# Patient Record
Sex: Female | Born: 1985 | Race: Black or African American | Hispanic: No | Marital: Single | Smoking: Never smoker
Health system: Southern US, Community
[De-identification: ages and names within clinical notes are randomized; demographics above are authoritative.]

## PROBLEM LIST (undated history)

## (undated) DIAGNOSIS — Z8619 Personal history of other infectious and parasitic diseases: Secondary | ICD-10-CM

## (undated) DIAGNOSIS — R87629 Unspecified abnormal cytological findings in specimens from vagina: Secondary | ICD-10-CM

## (undated) DIAGNOSIS — A609 Anogenital herpesviral infection, unspecified: Secondary | ICD-10-CM

## (undated) HISTORY — DX: Anogenital herpesviral infection, unspecified: A60.9

## (undated) HISTORY — PX: NO PAST SURGERIES: SHX2092

## (undated) HISTORY — PX: COLPOSCOPY: SHX161

## (undated) HISTORY — DX: Unspecified abnormal cytological findings in specimens from vagina: R87.629

## (undated) HISTORY — DX: Personal history of other infectious and parasitic diseases: Z86.19

---
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
## 2011-05-03 ENCOUNTER — Other Ambulatory Visit: Payer: Self-pay | Admitting: Family Medicine

## 2011-05-03 DIAGNOSIS — N6313 Unspecified lump in the right breast, lower outer quadrant: Secondary | ICD-10-CM

## 2011-05-03 DIAGNOSIS — N6321 Unspecified lump in the left breast, upper outer quadrant: Secondary | ICD-10-CM

## 2011-05-06 ENCOUNTER — Other Ambulatory Visit: Payer: Self-pay

## 2011-05-09 ENCOUNTER — Ambulatory Visit
Admission: RE | Admit: 2011-05-09 | Discharge: 2011-05-09 | Disposition: A | Discharge status: Home / Self Care | Payer: PRIVATE HEALTH INSURANCE | Source: Ambulatory Visit | Attending: Family Medicine | Admitting: Family Medicine

## 2011-05-09 DIAGNOSIS — N6321 Unspecified lump in the left breast, upper outer quadrant: Secondary | ICD-10-CM

## 2011-05-09 DIAGNOSIS — N6313 Unspecified lump in the right breast, lower outer quadrant: Secondary | ICD-10-CM

## 2015-10-06 LAB — OB RESULTS CONSOLE RPR: RPR: NONREACTIVE

## 2015-10-06 LAB — OB RESULTS CONSOLE GC/CHLAMYDIA
CHLAMYDIA, DNA PROBE: NEGATIVE
GC PROBE AMP, GENITAL: NEGATIVE

## 2015-10-06 LAB — OB RESULTS CONSOLE ANTIBODY SCREEN: ANTIBODY SCREEN: NEGATIVE

## 2015-10-06 LAB — OB RESULTS CONSOLE RUBELLA ANTIBODY, IGM: Rubella: IMMUNE

## 2015-10-06 LAB — OB RESULTS CONSOLE HEPATITIS B SURFACE ANTIGEN: HEP B S AG: NEGATIVE

## 2015-10-06 LAB — OB RESULTS CONSOLE HIV ANTIBODY (ROUTINE TESTING): HIV: NONREACTIVE

## 2015-10-06 LAB — OB RESULTS CONSOLE ABO/RH: RH Type: POSITIVE

## 2016-05-11 ENCOUNTER — Encounter (HOSPITAL_COMMUNITY): Payer: Self-pay | Admitting: *Deleted

## 2016-05-11 ENCOUNTER — Telehealth (HOSPITAL_COMMUNITY): Payer: Self-pay | Admitting: *Deleted

## 2016-05-11 NOTE — Telephone Encounter (Signed)
Preadmission screen  

## 2016-05-12 ENCOUNTER — Other Ambulatory Visit: Payer: Self-pay | Admitting: Obstetrics and Gynecology

## 2016-05-12 DIAGNOSIS — O365930 Maternal care for other known or suspected poor fetal growth, third trimester, not applicable or unspecified: Secondary | ICD-10-CM

## 2016-05-13 ENCOUNTER — Encounter (HOSPITAL_COMMUNITY): Payer: Self-pay

## 2016-05-13 ENCOUNTER — Ambulatory Visit (HOSPITAL_COMMUNITY)
Admission: RE | Admit: 2016-05-13 | Discharge: 2016-05-13 | Disposition: A | Discharge status: Home / Self Care | Payer: BC Managed Care – PPO | Source: Ambulatory Visit | Attending: Obstetrics and Gynecology | Admitting: Obstetrics and Gynecology

## 2016-05-13 ENCOUNTER — Other Ambulatory Visit: Payer: Self-pay | Admitting: Obstetrics and Gynecology

## 2016-05-13 DIAGNOSIS — Z3A36 36 weeks gestation of pregnancy: Secondary | ICD-10-CM | POA: Diagnosis not present

## 2016-05-13 DIAGNOSIS — O365930 Maternal care for other known or suspected poor fetal growth, third trimester, not applicable or unspecified: Secondary | ICD-10-CM | POA: Insufficient documentation

## 2016-05-21 ENCOUNTER — Inpatient Hospital Stay (HOSPITAL_COMMUNITY): Admission: RE | Admit: 2016-05-21 | Payer: PRIVATE HEALTH INSURANCE | Source: Ambulatory Visit

## 2016-06-04 ENCOUNTER — Inpatient Hospital Stay (HOSPITAL_COMMUNITY): Payer: BC Managed Care – PPO | Admitting: Anesthesiology

## 2016-06-04 ENCOUNTER — Encounter (HOSPITAL_COMMUNITY): Payer: Self-pay | Admitting: *Deleted

## 2016-06-04 ENCOUNTER — Inpatient Hospital Stay (HOSPITAL_COMMUNITY)
Admission: AD | Admit: 2016-06-04 | Discharge: 2016-06-08 | DRG: 765 | Disposition: A | Discharge status: Home / Self Care | Payer: BC Managed Care – PPO | Source: Ambulatory Visit | Attending: Obstetrics and Gynecology | Admitting: Obstetrics and Gynecology

## 2016-06-04 ENCOUNTER — Encounter (HOSPITAL_COMMUNITY)
Admission: AD | Disposition: A | Discharge status: Home / Self Care | Payer: Self-pay | Source: Ambulatory Visit | Attending: Obstetrics and Gynecology

## 2016-06-04 DIAGNOSIS — O9902 Anemia complicating childbirth: Secondary | ICD-10-CM | POA: Diagnosis present

## 2016-06-04 DIAGNOSIS — O4290 Premature rupture of membranes, unspecified as to length of time between rupture and onset of labor, unspecified weeks of gestation: Secondary | ICD-10-CM

## 2016-06-04 DIAGNOSIS — D649 Anemia, unspecified: Secondary | ICD-10-CM | POA: Diagnosis not present

## 2016-06-04 DIAGNOSIS — O4202 Full-term premature rupture of membranes, onset of labor within 24 hours of rupture: Secondary | ICD-10-CM | POA: Diagnosis present

## 2016-06-04 DIAGNOSIS — Z3A40 40 weeks gestation of pregnancy: Secondary | ICD-10-CM

## 2016-06-04 DIAGNOSIS — O365930 Maternal care for other known or suspected poor fetal growth, third trimester, not applicable or unspecified: Secondary | ICD-10-CM | POA: Diagnosis present

## 2016-06-04 HISTORY — DX: Premature rupture of membranes, unspecified as to length of time between rupture and onset of labor, unspecified weeks of gestation: O42.90

## 2016-06-04 LAB — CBC
HEMATOCRIT: 33.6 % — AB (ref 36.0–46.0)
HEMOGLOBIN: 11.9 g/dL — AB (ref 12.0–15.0)
MCH: 31.4 pg (ref 26.0–34.0)
MCHC: 35.4 g/dL (ref 30.0–36.0)
MCV: 88.7 fL (ref 78.0–100.0)
Platelets: 288 10*3/uL (ref 150–400)
RBC: 3.79 MIL/uL — AB (ref 3.87–5.11)
RDW: 13.5 % (ref 11.5–15.5)
WBC: 9.2 10*3/uL (ref 4.0–10.5)

## 2016-06-04 LAB — OB RESULTS CONSOLE GBS: GBS: NEGATIVE

## 2016-06-04 LAB — TYPE AND SCREEN
ABO/RH(D): A POS
ANTIBODY SCREEN: NEGATIVE

## 2016-06-04 SURGERY — Surgical Case
Anesthesia: Epidural

## 2016-06-04 MED ORDER — LACTATED RINGERS IV SOLN: 500.0000 mL | INTRAVENOUS | Status: DC | PRN

## 2016-06-04 MED ORDER — SOD CITRATE-CITRIC ACID 500-334 MG/5ML PO SOLN
30.0000 mL | ORAL | Status: DC | PRN
  Administered 2016-06-04: 30 mL via ORAL
  Filled 2016-06-04: qty 15

## 2016-06-04 MED ORDER — ACETAMINOPHEN 325 MG PO TABS: 650.0000 mg | ORAL_TABLET | ORAL | Status: DC | PRN

## 2016-06-04 MED ORDER — PHENYLEPHRINE 40 MCG/ML (10ML) SYRINGE FOR IV PUSH (FOR BLOOD PRESSURE SUPPORT): 80.0000 ug | PREFILLED_SYRINGE | INTRAVENOUS | Status: DC | PRN

## 2016-06-04 MED ORDER — OXYTOCIN 40 UNITS IN LACTATED RINGERS INFUSION - SIMPLE MED
2.5000 [IU]/h | INTRAVENOUS | Status: DC
  Filled 2016-06-04: qty 1000

## 2016-06-04 MED ORDER — TERBUTALINE SULFATE 1 MG/ML IJ SOLN
INTRAMUSCULAR | Status: AC
Start: 2016-06-04 — End: 2016-06-04
  Administered 2016-06-04: 0.25 mg via SUBCUTANEOUS
  Filled 2016-06-04: qty 1

## 2016-06-04 MED ORDER — FENTANYL 2.5 MCG/ML BUPIVACAINE 1/10 % EPIDURAL INFUSION (WH - ANES)
14.0000 mL/h | INTRAMUSCULAR | Status: DC | PRN
  Administered 2016-06-04: 14 mL/h via EPIDURAL
  Filled 2016-06-04: qty 100

## 2016-06-04 MED ORDER — LACTATED RINGERS IV SOLN: INTRAVENOUS | Status: DC

## 2016-06-04 MED ORDER — EPHEDRINE 5 MG/ML INJ: 10.0000 mg | INTRAVENOUS | Status: DC | PRN

## 2016-06-04 MED ORDER — LIDOCAINE HCL (PF) 1 % IJ SOLN
30.0000 mL | INTRAMUSCULAR | Status: DC | PRN
  Filled 2016-06-04: qty 30

## 2016-06-04 MED ORDER — DIPHENHYDRAMINE HCL 50 MG/ML IJ SOLN: 12.5000 mg | INTRAMUSCULAR | Status: DC | PRN

## 2016-06-04 MED ORDER — ONDANSETRON HCL 4 MG/2ML IJ SOLN
4.0000 mg | Freq: Four times a day (QID) | INTRAMUSCULAR | Status: DC | PRN
  Administered 2016-06-04: 4 mg via INTRAVENOUS
  Filled 2016-06-04: qty 2

## 2016-06-04 MED ORDER — LACTATED RINGERS IV BOLUS (SEPSIS)
1000.0000 mL | Freq: Once | INTRAVENOUS | Status: DC
  Administered 2016-06-04: 1000 mL via INTRAVENOUS

## 2016-06-04 MED ORDER — LACTATED RINGERS IV SOLN: 500.0000 mL | Freq: Once | INTRAVENOUS | Status: DC

## 2016-06-04 MED ORDER — FENTANYL CITRATE (PF) 100 MCG/2ML IJ SOLN: 50.0000 ug | INTRAMUSCULAR | Status: DC | PRN

## 2016-06-04 MED ORDER — OXYTOCIN BOLUS FROM INFUSION: 500.0000 mL | Freq: Once | INTRAVENOUS | Status: DC

## 2016-06-04 MED ORDER — OXYCODONE-ACETAMINOPHEN 5-325 MG PO TABS: 2.0000 | ORAL_TABLET | ORAL | Status: DC | PRN

## 2016-06-04 MED ORDER — PHENYLEPHRINE 40 MCG/ML (10ML) SYRINGE FOR IV PUSH (FOR BLOOD PRESSURE SUPPORT)
80.0000 ug | PREFILLED_SYRINGE | INTRAVENOUS | Status: DC | PRN
  Filled 2016-06-04: qty 10

## 2016-06-04 MED ORDER — MORPHINE SULFATE (PF) 0.5 MG/ML IJ SOLN
INTRAMUSCULAR | Status: AC
  Filled 2016-06-04: qty 10

## 2016-06-04 MED ORDER — TERBUTALINE SULFATE 1 MG/ML IJ SOLN
0.2500 mg | Freq: Once | INTRAMUSCULAR | Status: AC | PRN
  Administered 2016-06-04: 0.25 mg via SUBCUTANEOUS
  Filled 2016-06-04: qty 1

## 2016-06-04 MED ORDER — OXYCODONE-ACETAMINOPHEN 5-325 MG PO TABS: 1.0000 | ORAL_TABLET | ORAL | Status: DC | PRN

## 2016-06-04 MED ORDER — LACTATED RINGERS IV SOLN
INTRAVENOUS | Status: DC
  Administered 2016-06-04: 20:00:00 via INTRAUTERINE

## 2016-06-04 MED ORDER — OXYTOCIN 40 UNITS IN LACTATED RINGERS INFUSION - SIMPLE MED: 1.0000 m[IU]/min | INTRAVENOUS | Status: DC

## 2016-06-04 MED ORDER — LIDOCAINE HCL (PF) 1 % IJ SOLN
INTRAMUSCULAR | Status: DC | PRN
  Administered 2016-06-04 (×2): 7 mL via EPIDURAL

## 2016-06-04 SURGICAL SUPPLY — 36 items
APL SKNCLS STERI-STRIP NONHPOA (GAUZE/BANDAGES/DRESSINGS) ×1
BENZOIN TINCTURE PRP APPL 2/3 (GAUZE/BANDAGES/DRESSINGS) ×2 IMPLANT
CHLORAPREP W/TINT 26ML (MISCELLANEOUS) ×3 IMPLANT
CLAMP CORD UMBIL (MISCELLANEOUS) IMPLANT
CLOSURE WOUND 1/2 X4 (GAUZE/BANDAGES/DRESSINGS)
CLOTH BEACON ORANGE TIMEOUT ST (SAFETY) ×3 IMPLANT
DRSG OPSITE POSTOP 4X10 (GAUZE/BANDAGES/DRESSINGS) ×3 IMPLANT
ELECT REM PT RETURN 9FT ADLT (ELECTROSURGICAL) ×3
ELECTRODE REM PT RTRN 9FT ADLT (ELECTROSURGICAL) ×1 IMPLANT
EXTRACTOR VACUUM KIWI (MISCELLANEOUS) IMPLANT
GLOVE BIO SURGEON ST LM GN SZ9 (GLOVE) ×3 IMPLANT
GLOVE BIOGEL PI IND STRL 7.0 (GLOVE) ×1 IMPLANT
GLOVE BIOGEL PI IND STRL 9 (GLOVE) ×1 IMPLANT
GLOVE BIOGEL PI INDICATOR 7.0 (GLOVE) ×2
GLOVE BIOGEL PI INDICATOR 9 (GLOVE) ×2
GOWN STRL REUS W/TWL 2XL LVL3 (GOWN DISPOSABLE) ×3 IMPLANT
GOWN STRL REUS W/TWL LRG LVL3 (GOWN DISPOSABLE) ×3 IMPLANT
NDL HYPO 25X5/8 SAFETYGLIDE (NEEDLE) IMPLANT
NEEDLE HYPO 25X5/8 SAFETYGLIDE (NEEDLE) IMPLANT
NS IRRIG 1000ML POUR BTL (IV SOLUTION) ×3 IMPLANT
PACK C SECTION WH (CUSTOM PROCEDURE TRAY) ×3 IMPLANT
PAD OB MATERNITY 4.3X12.25 (PERSONAL CARE ITEMS) ×3 IMPLANT
PENCIL SMOKE EVAC W/HOLSTER (ELECTROSURGICAL) ×3 IMPLANT
RTRCTR C-SECT PINK 25CM LRG (MISCELLANEOUS) IMPLANT
RTRCTR C-SECT PINK 34CM XLRG (MISCELLANEOUS) IMPLANT
STRIP CLOSURE SKIN 1/2X4 (GAUZE/BANDAGES/DRESSINGS) IMPLANT
SUT MNCRL 0 VIOLET CTX 36 (SUTURE) ×2 IMPLANT
SUT MONOCRYL 0 CTX 36 (SUTURE) ×4
SUT VIC AB 0 CT1 27 (SUTURE) ×3
SUT VIC AB 0 CT1 27XBRD ANBCTR (SUTURE) ×1 IMPLANT
SUT VIC AB 2-0 CT1 27 (SUTURE) ×3
SUT VIC AB 2-0 CT1 TAPERPNT 27 (SUTURE) ×1 IMPLANT
SUT VIC AB 4-0 KS 27 (SUTURE) ×3 IMPLANT
SYR BULB IRRIGATION 50ML (SYRINGE) IMPLANT
TOWEL OR 17X24 6PK STRL BLUE (TOWEL DISPOSABLE) ×3 IMPLANT
TRAY FOLEY CATH SILVER 14FR (SET/KITS/TRAYS/PACK) ×3 IMPLANT

## 2016-06-04 NOTE — MAU Note (Signed)
Pt states her water broke at 1754 and was green.  Pt states contractions started around 1530 and are 5 minutes apart.

## 2016-06-04 NOTE — H&P (Signed)
Casey Campbell is a 30 y.o. female, G1P0 at 40 weeks, presenting for SROM mec stained fluid at 1754.  Fm+  Denies bleeding.  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  No outbreaks during pregnancy.   Fetus SGA and followed with testing.  Per MFM no induction needed.  Patient Active Problem List   Diagnosis Date Noted  . SPROM (prolonged spontaneous rupture of membranes) 06/04/2016  . Delayed delivery after SROM (spontaneous rupture of membranes) 06/04/2016    History of present pregnancy: Patient entered care at 5 weeks.   EDC of 06/04/2016 was established by LMP.   Anatomy scan:  20 weeks, with normal findings and a posterior placenta.   Additional US evaluations:  23 weeks with EFW 28 %.  EFW 15% at 35 weeks.   Significant prenatal events:  SGA with   Last evaluation:  11/22/20117  OB History    Gravida Para Term Preterm AB Living   1             SAB TAB Ectopic Multiple Live Births                 Past Medical History:  Diagnosis Date  . HSV (herpes simplex virus) anogenital infection   . Hx of varicella    Past Surgical History:  Procedure Laterality Date  . NO PAST SURGERIES     Family History: family history is not on file. Social History:  reports that she has never smoked. She has never used smokeless tobacco. She reports that she does not drink alcohol or use drugs.   Prenatal Transfer Tool  Maternal Diabetes: No Genetic Screening: Normal Maternal Ultrasounds/Referrals: Normal Fetal Ultrasounds or other Referrals:  Referred to Materal Fetal Medicine  Maternal Substance Abuse:  No Significant Maternal Medications:  Valtrex Significant Maternal Lab Results: None  TDAP Declined Flu Declined  ROS:  Reviewed and negative except as stated above  No Known Allergies   Dilation: 3 Effacement (%): 80 Station: -1 Exam by:: AYetta Barre. Jones RNC Blood pressure 113/65, pulse 76, temperature 97.8 F (36.6 C), temperature source Oral, resp. rate 18,  last menstrual period 08/29/2015, SpO2 100 %.  Chest clear Heart RRR without murmur Abd gravid, NT, FH appropriate Pelvic: per RN Ext: wnl  FHR: Category 1 UCs:  2 in 10 minutes  Prenatal labs: ABO, Rh: A/Positive/-- (03/28 0000) Antibody: Negative (03/28 0000) Rubella: Immune RPR: Nonreactive (03/28 0000)  HBsAg: Negative (03/28 0000)  HIV: Non-reactive (03/28 0000)  GBS:  Negative Sickle cell/Hgb electrophoresis: Negative Pap:  2016  Negative GC:  Negative Chlamydia:  Negative Genetic screening Negative Genetic screenings: wnl Glucola:  wnl Other:   Hgb 12.7 at NOB,10.9  at 28 weeks       Assessment/Plan: IUP at 40 weeks  [facility] Dr. Estanislado Pandyivard Routine [facility] orders Pain med/epidural prn   Henderson NewcomerNancy Jean ProtheroCNM, MSN 06/04/2016, 6:55 PM

## 2016-06-04 NOTE — Progress Notes (Signed)
Milus MallickMone M Wrobel MRN: [id number]  Subjective: -Strip Reviewed.  In room to assess.  Patient sitting in high fowlers with O2 on via NRB.  Reports comfort with epidural despite intermittent rectal pressure.  No q/c.  Objective: BP 110/65   Pulse 83   Temp 97.7 F (36.5 C) (Oral)   Resp 16   Ht 5\' 2"  (1.575 m)   Wt 64.9 kg (143 lb)   LMP 08/29/2015   SpO2 100%   BMI 26.16 kg/m  No intake/output data recorded. No intake/output data recorded.  Fetal Monitoring: FHT: 145 bpm, Mod Var, +Early Decels, -Accels UC: Q1-2, palpates strong. MVUs 175mmHg  Vaginal Exam: SVE:   Dilation: 6.5 Effacement (%): 90 Station: 0 Exam by:: Alfonzo BeersMolly Trott, RN  Membranes:SROM x 3hrs Internal Monitors: IUPC in place  Augmentation/Induction: Pitocin:N/A Cytotec: N/A  Assessment:  IUP at 40wks Cat I FT  Active Labor  Plan: -Will leave in high fowlers as infant tolerating well -Start to titrate O2 per protocol -Will reassess prn -Continue other mgmt as ordered   Valma CavaJessica L Kindred Reidinger,MSN, CNM 06/04/2016, 9:55 PM

## 2016-06-04 NOTE — Anesthesia Preprocedure Evaluation (Addendum)
Anesthesia Evaluation  Patient identified by MRN, date of birth, ID band Patient awake    Reviewed: Allergy & Precautions, H&P , NPO status , Patient's Chart, lab work & pertinent test results  Airway Mallampati: I  TM Distance: >3 FB Neck ROM: full    Dental no notable dental hx.    Pulmonary neg pulmonary ROS,    Pulmonary exam normal        Cardiovascular negative cardio ROS Normal cardiovascular exam     Neuro/Psych negative neurological ROS  negative psych ROS   GI/Hepatic negative GI ROS, Neg liver ROS,   Endo/Other  negative endocrine ROS  Renal/GU negative Renal ROS     Musculoskeletal   Abdominal Normal abdominal exam  (+)   Peds  Hematology negative hematology ROS (+)   Anesthesia Other Findings   Reproductive/Obstetrics (+) Pregnancy                             Anesthesia Physical Anesthesia Plan  ASA: II  Anesthesia Plan: Epidural   Post-op Pain Management:    Induction:   Airway Management Planned:   Additional Equipment:   Intra-op Plan:   Post-operative Plan:   Informed Consent: I have reviewed the patients History and Physical, chart, labs and discussed the procedure including the risks, benefits and alternatives for the proposed anesthesia with the patient or authorized representative who has indicated his/her understanding and acceptance.     Plan Discussed with: CRNA and Surgeon  Anesthesia Plan Comments: (For Csection for NRFHRT with labor epidural.)       Anesthesia Quick Evaluation

## 2016-06-04 NOTE — Anesthesia Procedure Notes (Signed)
Epidural Patient location during procedure: OB Start time: 06/04/2016 7:48 PM End time: 06/04/2016 7:52 PM  Staffing Anesthesiologist: Leilani AbleHATCHETT, Maedell Hedger Performed: anesthesiologist   Preanesthetic Checklist Completed: patient identified, surgical consent, pre-op evaluation, timeout performed, IV checked, risks and benefits discussed and monitors and equipment checked  Epidural Patient position: sitting Prep: site prepped and draped and DuraPrep Patient monitoring: continuous pulse ox and blood pressure Approach: midline Location: L3-L4 Injection technique: LOR air  Needle:  Needle type: Tuohy  Needle gauge: 17 G Needle length: 9 cm and 9 Needle insertion depth: 5 cm cm Catheter type: closed end flexible Catheter size: 19 Gauge Catheter at skin depth: 10 cm Test dose: negative and Other  Assessment Sensory level: T9 Events: blood not aspirated, injection not painful, no injection resistance, negative IV test and no paresthesia  Additional Notes Reason for block:procedure for pain

## 2016-06-04 NOTE — Anesthesia Pain Management Evaluation Note (Signed)
  CRNA Pain Management Visit Note  Patient: Casey Campbell, 30 y.o., female  "Hello I am a [facility]. We have an anesthesia team available at all times to provide care throughout the hospital, including epidural management and anesthesia for C-section. I don't know your plan for the delivery whether it a natural birth, water birth, IV sedation, nitrous supplementation, doula or epidural, but we want to meet your pain goals."   1.Was your pain managed to your expectations on prior hospitalizations?   No prior hospitalizations  2.What is your expectation for pain management during this hospitalization?     Epidural  3.How can we help you reach that goal? MDA called for epidural  Record the patient's initial score and the patient's pain goal.   Pain: 9  Pain Goal: 9 [facility] wants you to be able to say your pain was always managed very well.  Amika Tassin 06/04/2016

## 2016-06-04 NOTE — MAU Note (Signed)
Water broke in car en route to hosp. Called to assist from car.  Brought to rm via wc.  Thick meconium noted.

## 2016-06-04 NOTE — Progress Notes (Signed)
Casey MallickMone M Wahlquist MRN: [id number]  Subjective: -Care assumed of 30y.o. G1P0 at 40wks who presents for SROM with MSF. Nurse call and reports late decelerations.  Strip reviewed.  Dr. Lynford HumphreySR updated and provider to room to assess.  Patient reports desire for nonpharmacologic pain mgmt.  Objective: BP 118/72   Pulse 78   Temp 97.7 F (36.5 C) (Oral)   Resp 20   Ht 5\' 2"  (1.575 m)   Wt 64.9 kg (143 lb)   LMP 08/29/2015   SpO2 100%   BMI 26.16 kg/m  No intake/output data recorded. No intake/output data recorded.  Fetal Monitoring: FHT: 145 bpm, Mod Var, +Late Decels, -Accels UC: Q1-512min, coupling noted, palpates strong    Vaginal Exam: SVE:   Dilation: 5 Effacement (%): 90 Station: -3 Exam by:: j.Sheretta Grumbine, cnm Membranes:SROM x 1 hr Internal Monitors: IUPC and FSE inserted  Augmentation/Induction: Pitocin:None Cytotec: None  Assessment:  IUP at 40wks Cat II FT  Active Labor MSAF  Plan: -Dr. Lynford HumphreySR on line and instructs to give terbutaline and insert IUPC for amnioinfusion -Discussed IUPC r/b, prior to insertion, including increased risk of infection and ability to adequately monitor quantity and strength of contractions. -Discussed r/b of fetal scalp electrode including infection, trauma to fetal scalp, and ability to monitor fetal heart rate more accurately.  Patient w/o q/c and agrees to placement.  -Amnioinfusion initiated -Patient instructed to get an epidural as a means of managing pain and preparing for possibility of C/S if unable to correct Cat II FT -Family without Q/C -Continue other mgmt as ordered   Valma CavaJessica L Latonya Nelon,MSN, CNM 06/04/2016, 7:42 PM   1950: 155 bpm, Mod Var, -Decels, +Accels MVUs 100mmHg Patient receiving epidural Dr. Lynford HumphreySR updated on patient status  Cherre RobinsJessica L Paitlyn Mcclatchey MSN, CNM 06/04/2016 7:54 PM

## 2016-06-05 ENCOUNTER — Encounter (HOSPITAL_COMMUNITY): Payer: Self-pay

## 2016-06-05 DIAGNOSIS — O9902 Anemia complicating childbirth: Secondary | ICD-10-CM

## 2016-06-05 DIAGNOSIS — O4202 Full-term premature rupture of membranes, onset of labor within 24 hours of rupture: Secondary | ICD-10-CM

## 2016-06-05 DIAGNOSIS — D649 Anemia, unspecified: Secondary | ICD-10-CM

## 2016-06-05 DIAGNOSIS — O365930 Maternal care for other known or suspected poor fetal growth, third trimester, not applicable or unspecified: Secondary | ICD-10-CM

## 2016-06-05 DIAGNOSIS — Z3A40 40 weeks gestation of pregnancy: Secondary | ICD-10-CM

## 2016-06-05 LAB — RPR: RPR Ser Ql: NONREACTIVE

## 2016-06-05 LAB — ABO/RH: ABO/RH(D): A POS

## 2016-06-05 MED ORDER — KETOROLAC TROMETHAMINE 30 MG/ML IJ SOLN
30.0000 mg | Freq: Four times a day (QID) | INTRAMUSCULAR | Status: DC | PRN
  Administered 2016-06-05: 30 mg via INTRAMUSCULAR

## 2016-06-05 MED ORDER — PHENYLEPHRINE 40 MCG/ML (10ML) SYRINGE FOR IV PUSH (FOR BLOOD PRESSURE SUPPORT)
PREFILLED_SYRINGE | INTRAVENOUS | Status: AC
  Filled 2016-06-05: qty 10

## 2016-06-05 MED ORDER — KETOROLAC TROMETHAMINE 30 MG/ML IJ SOLN
INTRAMUSCULAR | Status: AC
  Filled 2016-06-05: qty 1

## 2016-06-05 MED ORDER — SCOPOLAMINE 1 MG/3DAYS TD PT72
MEDICATED_PATCH | TRANSDERMAL | Status: AC
  Filled 2016-06-05: qty 1

## 2016-06-05 MED ORDER — NALOXONE HCL 2 MG/2ML IJ SOSY
1.0000 ug/kg/h | PREFILLED_SYRINGE | INTRAVENOUS | Status: DC | PRN
  Filled 2016-06-05: qty 2

## 2016-06-05 MED ORDER — ACETAMINOPHEN 500 MG PO TABS
1000.0000 mg | ORAL_TABLET | Freq: Four times a day (QID) | ORAL | Status: AC
  Administered 2016-06-05 (×4): 1000 mg via ORAL
  Filled 2016-06-05 (×4): qty 2

## 2016-06-05 MED ORDER — PROMETHAZINE HCL 25 MG/ML IJ SOLN: 6.2500 mg | INTRAMUSCULAR | Status: DC | PRN

## 2016-06-05 MED ORDER — DEXAMETHASONE SODIUM PHOSPHATE 4 MG/ML IJ SOLN
INTRAMUSCULAR | Status: AC
  Filled 2016-06-05: qty 1

## 2016-06-05 MED ORDER — ZOLPIDEM TARTRATE 5 MG PO TABS: 5.0000 mg | ORAL_TABLET | Freq: Every evening | ORAL | Status: DC | PRN

## 2016-06-05 MED ORDER — ACETAMINOPHEN 325 MG PO TABS: 650.0000 mg | ORAL_TABLET | ORAL | Status: DC | PRN

## 2016-06-05 MED ORDER — NALBUPHINE HCL 10 MG/ML IJ SOLN: 5.0000 mg | INTRAMUSCULAR | Status: DC | PRN

## 2016-06-05 MED ORDER — NALOXONE HCL 0.4 MG/ML IJ SOLN: 0.4000 mg | INTRAMUSCULAR | Status: DC | PRN

## 2016-06-05 MED ORDER — OXYTOCIN 10 UNIT/ML IJ SOLN
INTRAMUSCULAR | Status: DC | PRN
  Administered 2016-06-05: 40 [IU] via INTRAVENOUS

## 2016-06-05 MED ORDER — SCOPOLAMINE 1 MG/3DAYS TD PT72
1.0000 | MEDICATED_PATCH | Freq: Once | TRANSDERMAL | Status: DC
  Filled 2016-06-05: qty 1

## 2016-06-05 MED ORDER — LACTATED RINGERS IV SOLN
INTRAVENOUS | Status: DC
  Administered 2016-06-05: 12:00:00 via INTRAVENOUS

## 2016-06-05 MED ORDER — PHENYLEPHRINE 40 MCG/ML (10ML) SYRINGE FOR IV PUSH (FOR BLOOD PRESSURE SUPPORT)
PREFILLED_SYRINGE | INTRAVENOUS | Status: DC | PRN
  Administered 2016-06-05 (×2): 80 ug via INTRAVENOUS

## 2016-06-05 MED ORDER — METHYLERGONOVINE MALEATE 0.2 MG/ML IJ SOLN: 0.2000 mg | INTRAMUSCULAR | Status: DC | PRN

## 2016-06-05 MED ORDER — MEPERIDINE HCL 25 MG/ML IJ SOLN
INTRAMUSCULAR | Status: DC | PRN
  Administered 2016-06-05 (×2): 12.5 mg via INTRAVENOUS

## 2016-06-05 MED ORDER — ONDANSETRON HCL 4 MG/2ML IJ SOLN: 4.0000 mg | Freq: Three times a day (TID) | INTRAMUSCULAR | Status: DC | PRN

## 2016-06-05 MED ORDER — OXYCODONE-ACETAMINOPHEN 5-325 MG PO TABS
1.0000 | ORAL_TABLET | ORAL | Status: DC | PRN
  Administered 2016-06-06 – 2016-06-08 (×3): 1 via ORAL
  Filled 2016-06-05 (×3): qty 1

## 2016-06-05 MED ORDER — DIPHENHYDRAMINE HCL 25 MG PO CAPS: 25.0000 mg | ORAL_CAPSULE | Freq: Four times a day (QID) | ORAL | Status: DC | PRN

## 2016-06-05 MED ORDER — CEFAZOLIN SODIUM-DEXTROSE 2-3 GM-% IV SOLR
INTRAVENOUS | Status: DC | PRN
  Administered 2016-06-04: 2 g via INTRAVENOUS

## 2016-06-05 MED ORDER — LACTATED RINGERS IV SOLN
INTRAVENOUS | Status: DC | PRN
  Administered 2016-06-04 – 2016-06-05 (×2): via INTRAVENOUS

## 2016-06-05 MED ORDER — SIMETHICONE 80 MG PO CHEW
80.0000 mg | CHEWABLE_TABLET | ORAL | Status: DC
  Administered 2016-06-05 – 2016-06-08 (×3): 80 mg via ORAL
  Filled 2016-06-05 (×3): qty 1

## 2016-06-05 MED ORDER — KETOROLAC TROMETHAMINE 30 MG/ML IJ SOLN: 30.0000 mg | Freq: Once | INTRAMUSCULAR | Status: DC

## 2016-06-05 MED ORDER — DIPHENHYDRAMINE HCL 50 MG/ML IJ SOLN
12.5000 mg | INTRAMUSCULAR | Status: DC | PRN
Start: 2016-06-05 — End: 2016-06-08

## 2016-06-05 MED ORDER — MEPERIDINE HCL 25 MG/ML IJ SOLN
INTRAMUSCULAR | Status: AC
  Filled 2016-06-05: qty 1

## 2016-06-05 MED ORDER — DIPHENHYDRAMINE HCL 25 MG PO CAPS
25.0000 mg | ORAL_CAPSULE | ORAL | Status: DC | PRN
  Administered 2016-06-05: 25 mg via ORAL
  Filled 2016-06-05 (×2): qty 1

## 2016-06-05 MED ORDER — TERBUTALINE SULFATE 1 MG/ML IJ SOLN: 0.2500 mg | Freq: Once | INTRAMUSCULAR | Status: DC

## 2016-06-05 MED ORDER — ONDANSETRON HCL 4 MG/2ML IJ SOLN
INTRAMUSCULAR | Status: AC
  Filled 2016-06-05: qty 2

## 2016-06-05 MED ORDER — FERROUS SULFATE 325 (65 FE) MG PO TABS
325.0000 mg | ORAL_TABLET | Freq: Two times a day (BID) | ORAL | Status: DC
  Administered 2016-06-05 – 2016-06-08 (×7): 325 mg via ORAL
  Filled 2016-06-05 (×7): qty 1

## 2016-06-05 MED ORDER — HYDROMORPHONE HCL 1 MG/ML IJ SOLN: 0.2500 mg | INTRAMUSCULAR | Status: DC | PRN

## 2016-06-05 MED ORDER — MENTHOL 3 MG MT LOZG: 1.0000 | LOZENGE | OROMUCOSAL | Status: DC | PRN

## 2016-06-05 MED ORDER — METHYLERGONOVINE MALEATE 0.2 MG/ML IJ SOLN
INTRAMUSCULAR | Status: AC
  Filled 2016-06-05: qty 1

## 2016-06-05 MED ORDER — MORPHINE SULFATE (PF) 0.5 MG/ML IJ SOLN
INTRAMUSCULAR | Status: DC | PRN
  Administered 2016-06-05: 4 mg via EPIDURAL

## 2016-06-05 MED ORDER — SCOPOLAMINE 1 MG/3DAYS TD PT72
MEDICATED_PATCH | TRANSDERMAL | Status: DC | PRN
  Administered 2016-06-04: 1 via TRANSDERMAL

## 2016-06-05 MED ORDER — SIMETHICONE 80 MG PO CHEW
80.0000 mg | CHEWABLE_TABLET | Freq: Three times a day (TID) | ORAL | Status: DC
  Administered 2016-06-05 – 2016-06-08 (×8): 80 mg via ORAL
  Filled 2016-06-05 (×9): qty 1

## 2016-06-05 MED ORDER — OXYCODONE-ACETAMINOPHEN 5-325 MG PO TABS
2.0000 | ORAL_TABLET | ORAL | Status: DC | PRN
  Administered 2016-06-06 – 2016-06-07 (×5): 2 via ORAL
  Filled 2016-06-05 (×5): qty 2

## 2016-06-05 MED ORDER — OXYTOCIN 40 UNITS IN LACTATED RINGERS INFUSION - SIMPLE MED: 2.5000 [IU]/h | INTRAVENOUS | Status: AC

## 2016-06-05 MED ORDER — SODIUM CHLORIDE 0.9% FLUSH: 3.0000 mL | INTRAVENOUS | Status: DC | PRN

## 2016-06-05 MED ORDER — TETANUS-DIPHTH-ACELL PERTUSSIS 5-2.5-18.5 LF-MCG/0.5 IM SUSP: 0.5000 mL | Freq: Once | INTRAMUSCULAR | Status: DC

## 2016-06-05 MED ORDER — WITCH HAZEL-GLYCERIN EX PADS: 1.0000 "application " | MEDICATED_PAD | CUTANEOUS | Status: DC | PRN

## 2016-06-05 MED ORDER — NALBUPHINE HCL 10 MG/ML IJ SOLN: 5.0000 mg | Freq: Once | INTRAMUSCULAR | Status: DC | PRN

## 2016-06-05 MED ORDER — SENNOSIDES-DOCUSATE SODIUM 8.6-50 MG PO TABS
2.0000 | ORAL_TABLET | ORAL | Status: DC
  Administered 2016-06-05 – 2016-06-08 (×3): 2 via ORAL
  Filled 2016-06-05 (×3): qty 2

## 2016-06-05 MED ORDER — MEPERIDINE HCL 25 MG/ML IJ SOLN: 6.2500 mg | INTRAMUSCULAR | Status: DC | PRN

## 2016-06-05 MED ORDER — MEASLES, MUMPS & RUBELLA VAC ~~LOC~~ INJ
0.5000 mL | INJECTION | Freq: Once | SUBCUTANEOUS | Status: DC
  Filled 2016-06-05: qty 0.5

## 2016-06-05 MED ORDER — KETOROLAC TROMETHAMINE 30 MG/ML IJ SOLN: 30.0000 mg | Freq: Four times a day (QID) | INTRAMUSCULAR | Status: DC | PRN

## 2016-06-05 MED ORDER — PRENATAL MULTIVITAMIN CH
1.0000 | ORAL_TABLET | Freq: Every day | ORAL | Status: DC
  Administered 2016-06-05 – 2016-06-08 (×4): 1 via ORAL
  Filled 2016-06-05 (×4): qty 1

## 2016-06-05 MED ORDER — SIMETHICONE 80 MG PO CHEW: 80.0000 mg | CHEWABLE_TABLET | ORAL | Status: DC | PRN

## 2016-06-05 MED ORDER — ONDANSETRON HCL 4 MG/2ML IJ SOLN
INTRAMUSCULAR | Status: DC | PRN
  Administered 2016-06-05: 4 mg via INTRAVENOUS

## 2016-06-05 MED ORDER — IBUPROFEN 600 MG PO TABS
600.0000 mg | ORAL_TABLET | Freq: Four times a day (QID) | ORAL | Status: DC
  Administered 2016-06-05 – 2016-06-08 (×13): 600 mg via ORAL
  Filled 2016-06-05 (×13): qty 1

## 2016-06-05 MED ORDER — IBUPROFEN 600 MG PO TABS: 600.0000 mg | ORAL_TABLET | Freq: Four times a day (QID) | ORAL | Status: DC | PRN

## 2016-06-05 MED ORDER — DEXAMETHASONE SODIUM PHOSPHATE 4 MG/ML IJ SOLN
INTRAMUSCULAR | Status: DC | PRN
  Administered 2016-06-05: 4 mg via INTRAVENOUS

## 2016-06-05 MED ORDER — COCONUT OIL OIL
1.0000 "application " | TOPICAL_OIL | Status: DC | PRN
  Administered 2016-06-06: 1 via TOPICAL
  Filled 2016-06-05: qty 120

## 2016-06-05 MED ORDER — DIBUCAINE 1 % RE OINT: 1.0000 "application " | TOPICAL_OINTMENT | RECTAL | Status: DC | PRN

## 2016-06-05 MED ORDER — METHYLERGONOVINE MALEATE 0.2 MG PO TABS: 0.2000 mg | ORAL_TABLET | ORAL | Status: DC | PRN

## 2016-06-05 MED ORDER — OXYTOCIN 10 UNIT/ML IJ SOLN
INTRAMUSCULAR | Status: AC
  Filled 2016-06-05: qty 4

## 2016-06-05 MED ORDER — LACTATED RINGERS IV SOLN
INTRAVENOUS | Status: DC | PRN
  Administered 2016-06-05: via INTRAVENOUS

## 2016-06-05 MED ORDER — SODIUM BICARBONATE 8.4 % IV SOLN
INTRAVENOUS | Status: DC | PRN
  Administered 2016-06-04: 10 mL via EPIDURAL

## 2016-06-05 NOTE — Lactation Note (Signed)
This note was copied from a baby's chart. Lactation Consultation Note  Patient Name: Girl Vicente MalesMone Flanigan UVOZD'GToday's Date: 06/05/2016 Reason for consult: Initial assessment Baby at 12 hr of life. Upon entry baby was swaddled, sleeping in mom's arms. Attempted to wake baby to feed but baby is very sleepy. Set up DEBP and reviewed manual expression. Mom has a DEBP Medela pump that she was given to her by someone. Pointed out the "single user" statement and discussed ways that she could get her own pump. Discussed baby behavior, feeding frequency, pumping frequency, baby belly size, voids, wt loss, breast changes, and nipple care. Given lactation handouts. Aware of OP services and support group. Mom will offer the breast 8+/24hr, post express, and supplement with her milk per volume guidelines. She will call for lactation as needed.     Maternal Data Has patient been taught Hand Expression?: Yes Does the patient have breastfeeding experience prior to this delivery?: No  Feeding Feeding Type: Breast Fed Length of feed: 0 min  LATCH Score/Interventions                      Lactation Tools Discussed/Used WIC Program: No Pump Review: Setup, frequency, and cleaning;Milk Storage;Other (comment) (pump settings ) Initiated by:: ES Date initiated:: 06/05/16   Consult Status Consult Status: Follow-up Date: 06/06/16 Follow-up type: In-patient    Rulon Eisenmengerlizabeth E Makenzie Vittorio 06/05/2016, 12:14 PM

## 2016-06-05 NOTE — Anesthesia Postprocedure Evaluation (Signed)
Anesthesia Post Note  Patient: Casey Campbell  Procedure(s) Performed: Procedure(s) (LRB): CESAREAN SECTION (N/A)  Patient location during evaluation: Mother Baby Anesthesia Type: Epidural Level of consciousness: awake and alert Pain management: pain level controlled Vital Signs Assessment: post-procedure vital signs reviewed and stable Respiratory status: spontaneous breathing, nonlabored ventilation and respiratory function stable Cardiovascular status: stable Postop Assessment: no headache, no backache, epidural receding and patient able to bend at knees Anesthetic complications: no     Last Vitals:  Vitals:   06/05/16 0530 06/05/16 0620  BP: 117/67 110/68  Pulse: 72 80  Resp:    Temp: 36.5 C 36.6 C    Last Pain:  Vitals:   06/05/16 0620  TempSrc: Oral  PainSc: 0-No pain   Pain Goal:                 Rica RecordsICKELTON,Allison Silva

## 2016-06-05 NOTE — Op Note (Signed)
Preoperative diagnosis: Intrauterine pregnancy at 40 weeks with non-reassuring fetal heart rate  Post operative diagnosis: Same  Anesthesia: Epidural  Anesthesiologist: Dr. Krista BlueSinger  Procedure: Primary low transverse cesarean section  Surgeon: Dr Emelda Fearferguson and Dr. Dois DavenportSandra Abygale Karpf  Assistant: Gerrit Heckjessica Emly CNM  Estimated blood loss: 600 cc  CALLED AT HOME AT 11:48 PM BY L&D STAFF INFORMING ME OF A PROLONGED BRADYCARDIA WITH DR Emelda FearFERGUSON AT THE BEDSIDE CALLED FOR A STAT CESAREAN SECTION. I WILL MEET THEM IN THE OR.   UPON MY ARRIVAL, BABY IS BORN AND TENDED TO BY NICU TEAM AND DR Emelda FearFERGUSON IS COLLECTING CORD BLOOD. PATIENT IS UNDER EPIDURAL.  Procedure:  Through a Pfannenstiel incision,the myometrium was entered in a low transverse fashion, 2 cm above the vesico-uterine junction . Amniotic fluid is meconium stained. We assist the birth of a female  infant in vertex presentation with 1 loose nuchal cord per dr Rayna Sextonferguson's report. Mouth and nose are suctioned. The baby is delivered. The cord is clamped and sectioned. The baby is given to the neonatologist present in the room.  10 cc of blood is drawn from the umbilical vein.The placenta is allowed to deliver spontaneously. It is complete and the cord has 3 vessels.  I then scrubbed Dr Emelda FearFerguson out and completed the surgery.   Uterine revision is negative.  We proceed with closure of the myometrium in 2 layers: First with a running locked suture of 0 Vicryl, then with a Lembert suture of 0 Vicryl imbricating the first one. Hemostasis is completed with cauterization on peritoneal edges.  Both paracolic gutters are cleaned. Both tubes and ovaries are assessed and normal. The pelvis is profusely irrigated with warm saline to confirm a satisfactory hemostasis.  Retractors and sponges are removed. Under fascia hemostasis is completed with cauterization. The fascia is then closed with 2 running sutures of 0 Vicryl meeting midline. The wound is  irrigated with warm saline and hemostasis is completed with cauterization. The skin is closed with a subcuticular suture of 3-0 Monocryl and Steri-Strips.  Instrument and sponge count is complete x2. Estimated blood loss is 600 cc.  The procedure is well tolerated by the patient who is taken to recovery room in a well and stable condition.  female baby named Edwyna ShellHart was born at 00:04 and received an Apgar of 7  at 1 minute and 9 at 5 minutes. Cord pH=7.17   Specimen: Placenta sent to pathology.   [name] A MD 11/26/201712:36 AM

## 2016-06-05 NOTE — Progress Notes (Addendum)
Casey MallickMone M Campbell MRN: [id number]  Subjective: -Nurse call reports prolonged deceleration after a late deceleration.  States Faculty MD in room assessing.  In room to assess.     Objective: BP (!) 123/56   Pulse 90   Temp 98.2 F (36.8 C) (Oral)   Resp 16   Ht 5\' 2"  (1.575 m)   Wt 64.9 kg (143 lb)   LMP 08/29/2015   SpO2 100%   Breastfeeding? Unknown   BMI 26.16 kg/m  No intake/output data recorded. Total I/O In: 1900 [I.V.:1900] Out: 1050 [Urine:300; Blood:750]  Fetal Monitoring: FHT: 145 bpm, Mod Var, -Decels, +Accels UC: Q1-252min, palpates strong    Vaginal Exam: SVE:   Dilation: 8 Effacement (%): 90 Station: -2 Exam by:: Dr. Emelda Campbell Campbell:SROM Internal Monitors: IUPC  Augmentation/Induction: Pitocin:None Cytotec: None  Assessment:  IUP at 40wks Cat II FT  Active Labor Non Reassuring FHT Remote from Delivery  Plan: -Stat C/S called due to NRFHT -Patient verbally consented--R/B reviewed including, but not limited to, infection, bleeding, pain, damage to organs or fetus resulting in need for additional surgery.  Patient understands and accepts these risks and wishes to proceed with c/s. -Dr. Lynford Campbell updated and en route -Dr. Sherryl MangesJ. Casey to OR, until arrival of Dr. Lynford Campbell, with this provider assisting  Casey CavaJessica L Gwendelyn Lanting,MSN, CNM 06/05/2016, 12:37 AM

## 2016-06-05 NOTE — Transfer of Care (Signed)
Immediate Anesthesia Transfer of Care Note  Patient: Casey Campbell  Procedure(s) Performed: Procedure(s): CESAREAN SECTION (N/A)  Patient Location: PACU  Anesthesia Type:Epidural  Level of Consciousness: awake, alert  and oriented  Airway & Oxygen Therapy: Patient Spontanous Breathing  Post-op Assessment: Report given to RN and Post -op Vital signs reviewed and stable  Post vital signs: Reviewed and stable  Last Vitals:  Vitals:   06/04/16 2300 06/04/16 2302  BP:  (!) 123/56  Pulse: 91 90  Resp:  16  Temp:      Last Pain:  Vitals:   06/04/16 2231  TempSrc: Oral  PainSc:          Complications: No apparent anesthesia complications

## 2016-06-05 NOTE — Anesthesia Postprocedure Evaluation (Signed)
Anesthesia Post Note  Patient: Casey Campbell  Procedure(s) Performed: Procedure(s) (LRB): CESAREAN SECTION (N/A)  Patient location during evaluation: PACU Anesthesia Type: Epidural Level of consciousness: awake Pain management: pain level controlled Vital Signs Assessment: post-procedure vital signs reviewed and stable Cardiovascular status: stable Postop Assessment: no headache, no backache, epidural receding, patient able to bend at knees and no signs of nausea or vomiting Anesthetic complications: no     Last Vitals:  Vitals:   06/05/16 0310 06/05/16 0420  BP: 120/72 116/67  Pulse: 75 73  Resp:    Temp: 36.8 C 36.4 C    Last Pain:  Vitals:   06/05/16 0420  TempSrc: Oral  PainSc: 0-No pain   Pain Goal:                 Oleva Koo JR,JOHN Lisbet Busker

## 2016-06-06 LAB — CBC
HEMATOCRIT: 24.7 % — AB (ref 36.0–46.0)
Hemoglobin: 8.8 g/dL — ABNORMAL LOW (ref 12.0–15.0)
MCH: 32 pg (ref 26.0–34.0)
MCHC: 35.6 g/dL (ref 30.0–36.0)
MCV: 89.8 fL (ref 78.0–100.0)
Platelets: 185 10*3/uL (ref 150–400)
RBC: 2.75 MIL/uL — AB (ref 3.87–5.11)
RDW: 14 % (ref 11.5–15.5)
WBC: 10.5 10*3/uL (ref 4.0–10.5)

## 2016-06-06 NOTE — Lactation Note (Signed)
This note was copied from a baby's chart. Lactation Consultation Note  Patient Name: Casey Campbell Reason for consult: Follow-up assessment;Infant < 6lbs  Baby 38 hours old. Mom reports that the baby sometimes nurses well, and other times bites and chews at the breast. Mom [facility] to latch [facility] in cradle position. Assisted mom to support the baby's head and attempted to latch in cross-cradle position, but baby would not latch. Demonstrated football position on right breast, but baby would only mouth the breast and not latch. Attempted to elicit a suckle with this LC's gloved finger, but baby only chewed and clamped down on gloved finger. Mom states that she has used DEBP twice, so enc mom to pump and then hand express. Mom able to express 3 ml of EBM which was given to the baby with syringe and finger. Baby tolerated well and suckled gently, though did not maintain a vacuum while suckling the gloved finger.   Plan is for mom to put baby to breast with cues and at least every 3 hours--waking as needed. Enc mom to supplement with EBM/formula, and then post-pump after each feeding followed by hand expression. Discussed with mom the need to use pump in order to stimulate breasts and to have EBM for supplementation. Mom is wanting to exclusively BF, and enc only to use formula if baby not nursing and EBM not increasing with pumping. Baby's suckling improved with EBM, so enc mom to use EBM prior to and after offering breast. Patient's bedside RN, Gaylyn RongKris, aware of assessment and interventions.  Maternal Data    Feeding Feeding Type: Breast Milk Length of feed: 0 min  LATCH Score/Interventions Latch: Too sleepy or reluctant, no latch achieved, no sucking elicited. Intervention(s): Skin to skin;Teach feeding cues Intervention(s): Adjust position;Breast compression  Audible Swallowing: None Intervention(s): Skin to skin;Hand expression  Type  of Nipple: Everted at rest and after stimulation Intervention(s): Double electric pump  Comfort (Breast/Nipple): Soft / non-tender     Hold (Positioning): Assistance needed to correctly position infant at breast and maintain latch. Intervention(s): Breastfeeding basics reviewed;Support Pillows;Position options;Skin to skin  LATCH Score: 5  Lactation Tools Discussed/Used     Consult Status Consult Status: Follow-up Date: 06/07/16 Follow-up type: In-patient    Sherlyn HayJennifer D Roland Prine Campbell, 2:46 PM

## 2016-06-06 NOTE — Progress Notes (Signed)
Casey Campbell, Casey Campbell Female, 30 y.o., Apr 05, 1986  Subjective: Postpartum Day 1: Cesarean Delivery Patient reports tolerating PO, + flatus and no problems voiding.  Ambulating without difficulty.      Objective: Vital signs in last 24 hours: Temp:  [97.8 F (36.6 C)-98.3 F (36.8 C)] 98.1 F (36.7 C) (11/27 0810) Pulse Rate:  [66-85] 76 (11/27 0810) Resp:  [16-20] 18 (11/27 0810) BP: (98-107)/(52-59) 100/56 (11/27 0810) SpO2:  [97 %] 97 % (11/26 2031)  Physical Exam:  General: alert, cooperative and no distress Lochia: appropriate Uterine Fundus: deferred as patient about to breastfeed. Incision: deferred as patient about to breastfeed.  Was checked by RN recently, normal per patient.  DVT Evaluation: No evidence of DVT seen on physical exam.   Recent Labs  06/04/16 1845 06/06/16 0506  HGB 11.9* 8.8*  HCT 33.6* 24.7*    Assessment/Plan: Status post Cesarean section. Doing well postoperatively.  Continue current care Prenatal vitamin tab with iron for anemia Out of bed and ambulation Breastfeeding Plan for discharge 06/08/16.   Konrad FelixKULWA,Treyshon Buchanon WAKURU, MD.  06/06/2016, 8:56 AM

## 2016-06-06 NOTE — Anesthesia Postprocedure Evaluation (Deleted)
Anesthesia Post Note  Patient: Casey Campbell  Procedure(s) Performed: Procedure(s) (LRB): CESAREAN SECTION (N/A)  Patient location during evaluation: PACU Anesthesia Type: Spinal Level of consciousness: awake Pain management: pain level controlled Vital Signs Assessment: post-procedure vital signs reviewed and stable Respiratory status: spontaneous breathing Cardiovascular status: stable Postop Assessment: no backache, spinal receding, patient able to bend at knees and no signs of nausea or vomiting Anesthetic complications: no     Last Vitals:  Vitals:   06/05/16 2031 06/06/16 0810  BP: (!) 98/53 (!) 100/56  Pulse: 85 76  Resp: 18 18  Temp: 36.8 C 36.7 C    Last Pain:  Vitals:   06/06/16 0841  TempSrc:   PainSc: 2    Pain Goal: Patients Stated Pain Goal: 3 (06/06/16 0841)               Bryce Cheever JR,JOHN Susann GivensFRANKLIN

## 2016-06-07 MED ORDER — GUAIFENESIN ER 600 MG PO TB12
1200.0000 mg | ORAL_TABLET | Freq: Two times a day (BID) | ORAL | Status: DC
  Administered 2016-06-07 – 2016-06-08 (×2): 1200 mg via ORAL
  Filled 2016-06-07 (×2): qty 2

## 2016-06-07 NOTE — Plan of Care (Signed)
Problem: Skin Integrity: Goal: Demonstration of wound healing without infection will improve Outcome: Progressing Honeycomb dressing in place. No new drainage.

## 2016-06-07 NOTE — Progress Notes (Signed)
I responded to the alarm for fetal bradycardia in labor and delivery shortly before midnight. This is patient of Dr. Estanislado Pandyivard and Clyde LundborgJessica Campbell CNM who is 8 cm and having severe decelerations into the 60s after contraction with very light requiring greater than 60 seconds. This was followed almost immediately by a prolonged deceleration lasting several minutes despite position changes and discontinuation of Pitocin and usual resuscitation interventions. At 6 minutes into this deceleration with Mrs. Casey Campbell CNM arriving for discussion., Briefly discuss case with similarly CNM and recommended that we proceed toward cesarean delivery in a stat fashion due to the nonresponsive recovery of the fetal heart rate. She agreed and we proceeded with cesarean delivery Delivery note patient was taken promptly to the OR under stat rolls. Upon arrival we reattached the fetal monitor and fetal heart rate was approximate 104-106, which was improved from being in the regular room. The cesarean section status was downgraded to urgent and this allowed us to proceed with abdominal prep followed by initiation of cesarean section after timeout administration of antibiotics. Transverse abdominal incision was made in standard method of Pfannenstiel with Mrs. Casey Campbell CNM as assistant, sharp with peritoneal cavity opened in the midline, bladder flap developed and transverse uterine incision made revealing meconium discolored amniotic fluid. The fetal vertex was rotated from os with posterior position and the incision and delivered in response to fundal pressure. Cord was clamped at approximate 45 seconds as the baby shows slow response to tactile stimulation and bulb suctioning. The infant was passed to the waiting pediatricians. Please see their notes for further details. Cred uterine massage resulted in delivery of the placenta and then the uterus was swabbed out. At this time the case was turned over to Dr. Estanislado Pandyivard for continuation of surgical  procedure and closures

## 2016-06-07 NOTE — Lactation Note (Signed)
This note was copied from a baby's chart. Lactation Consultation Note  Patient Name: Casey Campbell ZOXWR'UToday's Date: 06/07/2016 Reason for consult: Follow-up assessment Baby at 64 hr of life. Baby has a cone shaped head, tight jaw, tight shoulders/neck, noticeable lingual frenulum with a slightly anterior insertion point, and thick tight upper labial frenulum. Baby has a hard time creating suction with lips and tongue. Baby does a lot of biting down on a gloved finger. It is almost like baby is chewing with her mouth open instead of latching. Baby has a high palate, poor peristolic tongue movement, has poor tongue laterization, rarely/barely extends tongue over gum ridge, and can not lift tongue to the roof. Baby is causing mom to have sore nipples, no skin break down noted. Baby was unable to maintain suction on the gloved finger nor the breast. Baby is very gaggy. Demonstrated finger feeding, jaw massage, and tummy time. Mom will offer the breast q3hr or on demand, if baby is struggling she call for help. She will post pump and offer expressed milk per guidelines with a finger or a bottle to help baby learn to suck. Report given to RN.          Maternal Data    Feeding Feeding Type: Breast Milk Length of feed: 15 min  LATCH Score/Interventions                      Lactation Tools Discussed/Used     Consult Status Consult Status: Follow-up Date: 06/08/16 Follow-up type: In-patient    [name] 06/07/2016, 5:45 PM

## 2016-06-07 NOTE — Progress Notes (Signed)
Casey MallickMone M Campbell [id number] Postpartum Day 2 S/P Primary Cesarean Section due to NRFHT  Subjective: Patient up ad lib, denies syncope or dizziness. Reports consuming regular diet without issues and denies N/V. Patient reports no bowel movement, but is passing flatus.  Has been ambulating without issues. Denies issues with urination and reports bleeding is "okay."  Patient is breastfeeding and reports "getting better."  Undecided regarding postpartum contraception.  Pain is being appropriately managed with use of percocet.   Objective: Temp:  [98.1 F (36.7 C)-98.6 F (37 C)] 98.6 F (37 C) (11/27 1910) Pulse Rate:  [76-90] 84 (11/28 0531) Resp:  [18] 18 (11/28 0531) BP: (100-106)/(55-56) 105/55 (11/27 1910) SpO2:  [99 %] 99 % (11/27 1910)   Recent Labs  06/04/16 1845 06/06/16 0506  HGB 11.9* 8.8*  HCT 33.6* 24.7*  WBC 9.2 10.5    Physical Exam:  General: alert, cooperative, fatigued and no distress Mood/Affect: Appropriate/Calm Lungs: clear to auscultation, no wheezes, rales or rhonchi, symmetric air entry.  Heart: normal rate and regular rhythm. Breast: not examined, infant at breast Abdomen:  + bowel sounds, Soft, Appropriately Tender, Mild Distention Incision:  Honeycomb dressing with some drainage, otherwise CDI Uterine Fundus: firm, U/-1 Lochia: appropriate Skin: Warm, Dry. DVT Evaluation: No evidence of DVT seen on physical exam. Calf/Ankle edema is present, mild pitting JP drain:   None  Assessment Post Operative Day 2 S/P Primary C/S Normal Involution BreastFeeding Hemodynamically Stable Asymptomatic Anemia  Plan: Plan for discharge tomorrow Educated on wound care Continue other mgmt as ordered Dr. Sallyanne HaversEK to be updated on patient status  Casey RobinsJessica L Mubarak Bevens MSN, CNM 06/07/2016, 6:30 AM

## 2016-06-08 MED ORDER — OXYCODONE-ACETAMINOPHEN 5-325 MG PO TABS: 1.0000 | ORAL_TABLET | ORAL | 0 refills | Status: DC | PRN

## 2016-06-08 MED ORDER — IBUPROFEN 600 MG PO TABS: 600.0000 mg | ORAL_TABLET | Freq: Four times a day (QID) | ORAL | 0 refills | Status: DC | PRN

## 2016-06-08 NOTE — Lactation Note (Addendum)
This note was copied from a baby's chart. Lactation Consultation Note Baby hadn't been feeding well on the breast. Mom had pumped and bottle fed. Mom uses NS #20. Baby has high palate, at times chomps instead of suckling.  Mom sitting up in bed using DEBP, stating her milk was coming in. Asked mom if LC could assess breast. Breast was HARD!!! Hand massage while pumping. Mom only pumped 15ml d/t to engorgement. Laid mom down in bed w/arms over head, applied ICE, massage breast upwards towards axillary areas. Fitted #20 NS to mom, w/baby in sitting position beside of mom leaned baby forward onto moms Rt. Breast, latching well, suckling well. Adjusted bottom lip. Noted good suckles, mom stated no painful latching after adjusted lip. Breast massage during BF to assist in softening breast. Heard audible swallows. Between both breast, baby BF for 35 minutes. Rt. Breast became softer than Lt. Breast d/t more release. Noted milk in NS.  Instructed mom to rest in supine position w/ICE for 30 min, and massage at intervals then sit upright and pump breast.  Mom has a sports bra that she cut holes in it to hold flange for hands free. Encouraged mom to pump for 15-20 minutes, then BF again using NS.  Stressed importance of not letting breast engorged again if possible. Mom stated she woke up that way. Discussed hormone spike between 2-3am. Baby BF great, mom stated baby hasn't been BF that well. Mom has been pumping and giving colostrum in bottle. Gave mom 15ml that mom pumped.  Discussed filling, engorgement, milk storage and supply and demand.. Patient Name: Casey Vicente MalesMone Campbell ZOXWR'UToday's Date: 06/08/2016 Reason for consult: Follow-up assessment;Difficult latch;Infant < 6lbs   Maternal Data    Feeding Feeding Type: Breast Fed Length of feed: 35 min  LATCH Score/Interventions Latch: Repeated attempts needed to sustain latch, nipple held in mouth throughout feeding, stimulation needed to elicit sucking  reflex. Intervention(s): Skin to skin;Teach feeding cues;Waking techniques Intervention(s): Adjust position;Assist with latch;Breast massage;Breast compression  Audible Swallowing: Spontaneous and intermittent Intervention(s): Skin to skin;Hand expression Intervention(s): Alternate breast massage  Type of Nipple: Everted at rest and after stimulation Intervention(s): Double electric pump  Comfort (Breast/Nipple): Engorged, cracked, bleeding, large blisters, severe discomfort Problem noted: Engorgment Intervention(s): Ice;Hand expression;Reverse pressure  Problem noted: Mild/Moderate discomfort;Severe discomfort Interventions (Mild/moderate discomfort): Hand massage;Hand expression;Reverse pressue;Post-pump;Pre-pump if needed;Breast shields Interventions (Severe discomfort): Double electric pum  Hold (Positioning): Full assist, staff holds infant at breast Intervention(s): Support Pillows;Position options;Skin to skin;Breastfeeding basics reviewed  LATCH Score: 5  Lactation Tools Discussed/Used Tools: Nipple Dorris CarnesShields;Pump Nipple shield size: 20 Breast pump type: Double-Electric Breast Pump   Consult Status Consult Status: Follow-up Date: 06/08/16 Follow-up type: In-patient    Fabien Travelstead, Diamond NickelLAURA G 06/08/2016, 6:18 AM

## 2016-06-08 NOTE — Discharge Summary (Signed)
OB Discharge Summary     Patient Name: Casey MallickMone M Beldin DOB: 03-21-1986 MRN: [id number]  Date of admission: 06/04/2016 Delivering MD: Tilda BurrowFERGUSON, JOHN V   Date of discharge: 06/08/2016  Admitting diagnosis: 40 WKS, CTXS, WATER BROKE Intrauterine pregnancy: 5268w1d     Secondary diagnosis:  Active Problems:   SPROM (prolonged spontaneous rupture of membranes)   Delayed delivery after SROM (spontaneous rupture of membranes)   Cesarean delivery delivered  Additional problems: None     Discharge diagnosis: Term Pregnancy Delivered                                                                                                Post partum procedures:None  Augmentation: Pitocin  Complications: None  Hospital course:  Onset of Labor With Unplanned C/S  30 y.o. yo G1P1001 at 6268w1d was admitted in Latent Labor on 06/04/2016. Patient had a labor course significant for SROM. Membrane Rupture Time/Date: 5:45 PM ,06/04/2016   The patient went for cesarean section due to Non-Reassuring FHR, and delivered a Viable infant,06/05/2016  Details of operation can be found in separate operative note. Patient had an uncomplicated postpartum course.  She is ambulating,tolerating a regular diet, passing flatus, and urinating well.  Patient is discharged home in stable condition 06/08/16.   Physical exam Vitals:   06/06/16 1824 06/06/16 1910 06/07/16 0531 06/07/16 1759  BP: (!) 106/56 (!) 105/55  (!) 102/59  Pulse: 86 90 84 86  Resp: 18 18 18 18   Temp:  98.6 F (37 C)    TempSrc:  Oral    SpO2:  99%    Weight:      Height:       General: alert, cooperative and no distress  Breasts:  Engorged Lochia: appropriate Uterine Fundus: firm Incision: Healing well with no significant drainage, Dressing is clean, dry, and intact DVT Evaluation: No evidence of DVT seen on physical exam. Negative Homan's sign. Labs: Lab Results  Component Value Date   WBC 10.5 06/06/2016   HGB 8.8 (L) 06/06/2016   HCT  24.7 (L) 06/06/2016   MCV 89.8 06/06/2016   PLT 185 06/06/2016   No flowsheet data found.  Discharge instruction: per After Visit Summary and "Baby and Me Booklet".  After visit meds:    Medication List    TAKE these medications   ibuprofen 600 MG tablet Commonly known as:  ADVIL,MOTRIN Take 1 tablet (600 mg total) by mouth every 6 (six) hours as needed for mild pain.   oxyCODONE-acetaminophen 5-325 MG tablet Commonly known as:  PERCOCET/ROXICET Take 1 tablet by mouth every 4 (four) hours as needed (pain scale 4-7).   prenatal multivitamin Tabs tablet Take 1 tablet by mouth daily at 12 noon.   valACYclovir 500 MG tablet Commonly known as:  VALTREX Take 500 mg by mouth daily.       Diet: routine diet  Activity: Advance as tolerated. Pelvic rest for 6 weeks.   Outpatient follow up:6 weeks Follow up Appt:No future appointments. Follow up Visit:No Follow-up on file.  Postpartum contraception: Progesterone only pills  Newborn Data: Live born female  Birth Weight: 6 lb 1 oz (2750 g) APGAR: 7, 9  Baby Feeding: Breast  Not latching, Lactation working with mother Disposition:home with mother   06/08/2016 Kenney HousemanNancy Jean Curtina Grills, CNM

## 2016-06-08 NOTE — Progress Notes (Signed)
Mom discharged to home via wheelchair

## 2017-12-02 IMAGING — US US MFM OB DETAIL+14 WK
1 series · 14 of 28 positions shown · non-contrast
Comparison: none

[Series 1: us mfm ob detail+14 wk · 38 acquisitions, 14 frames shown]
[im 2/38]
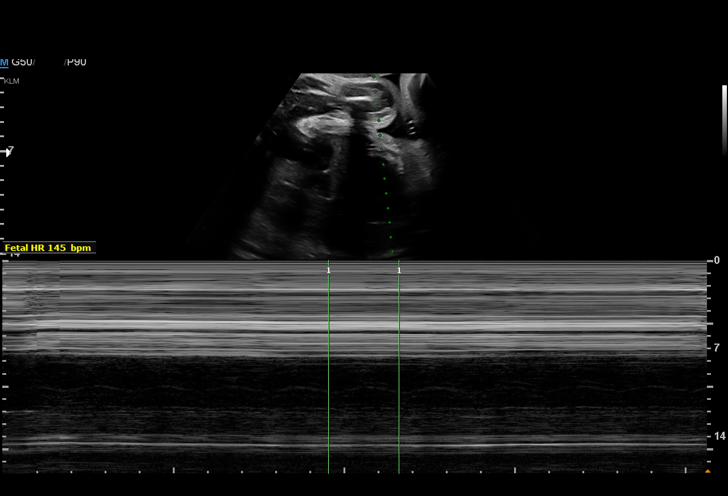
[im 5/38]
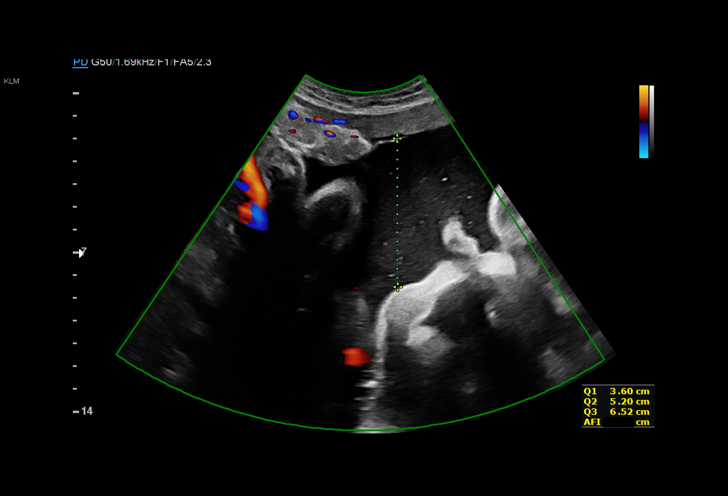
[im 7/38]
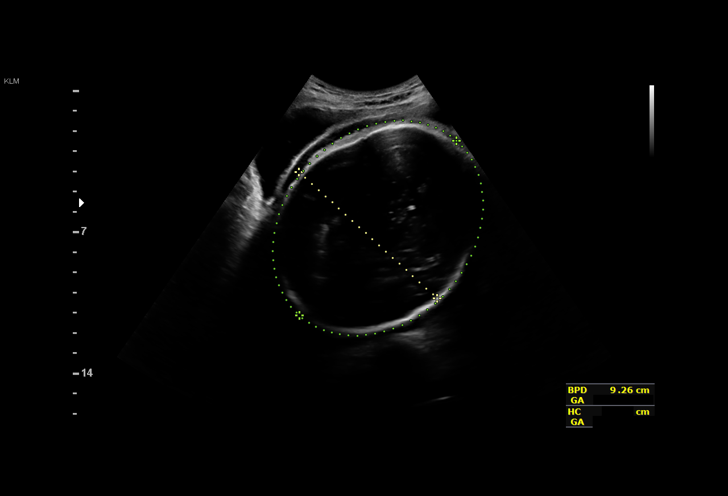
[im 10/38]
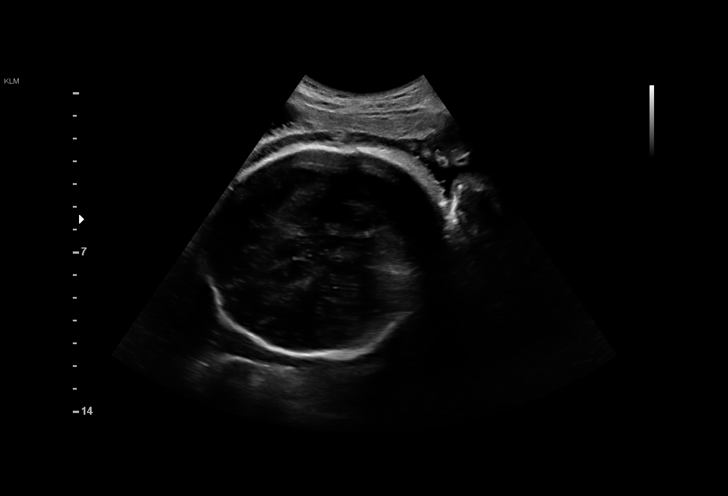
[im 13/38]
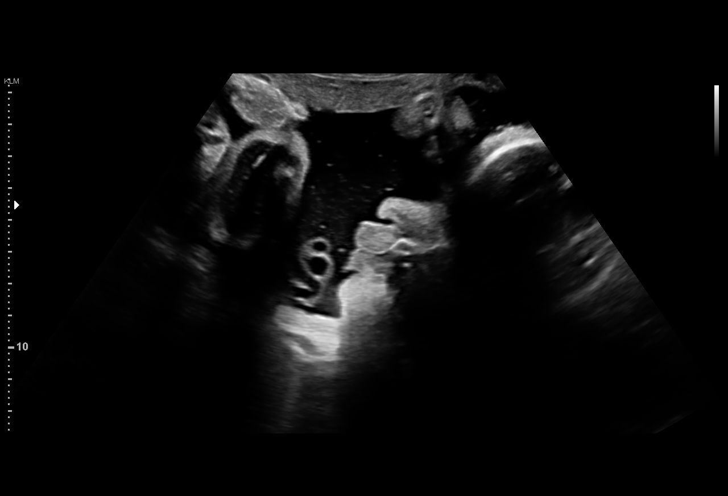
[im 16/38]
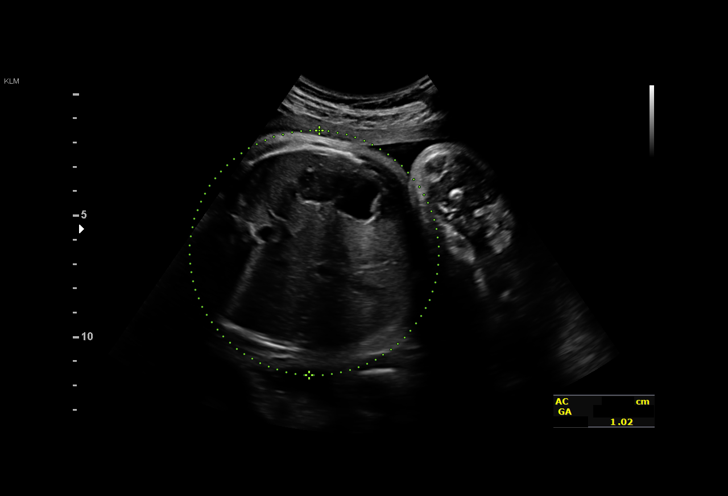
[im 18/38]
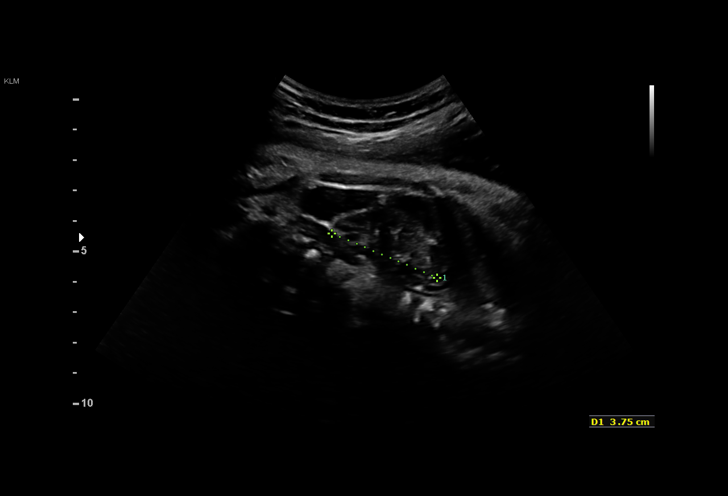
[im 21/38]
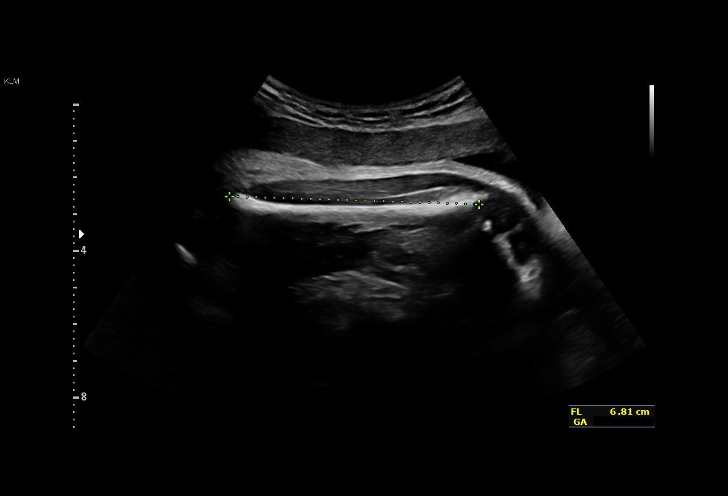
[im 24/38]
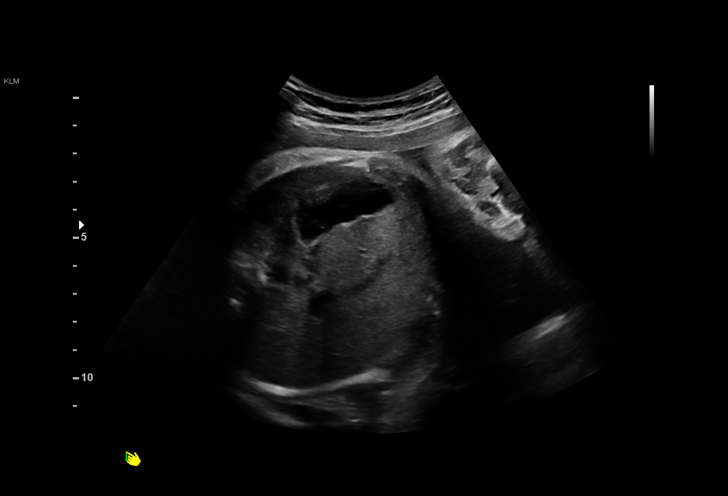
[im 27/38]
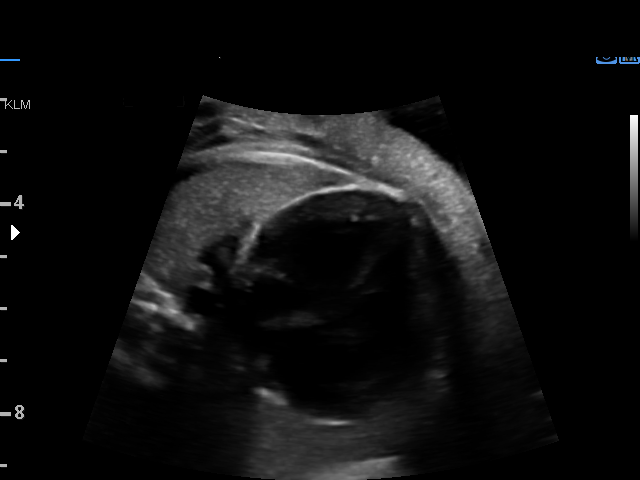
[im 29/38]
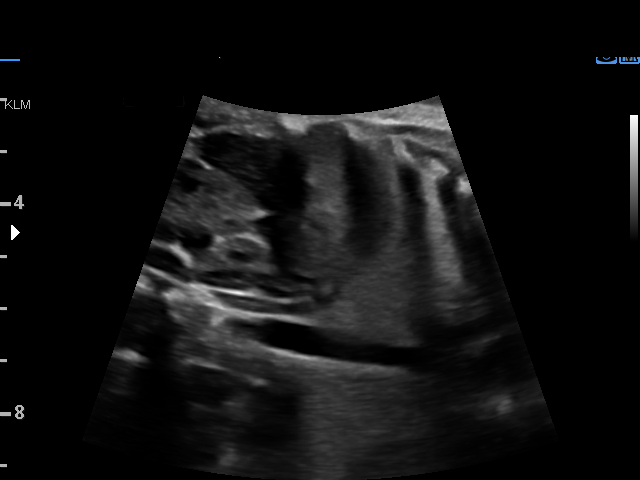
[im 32/38]
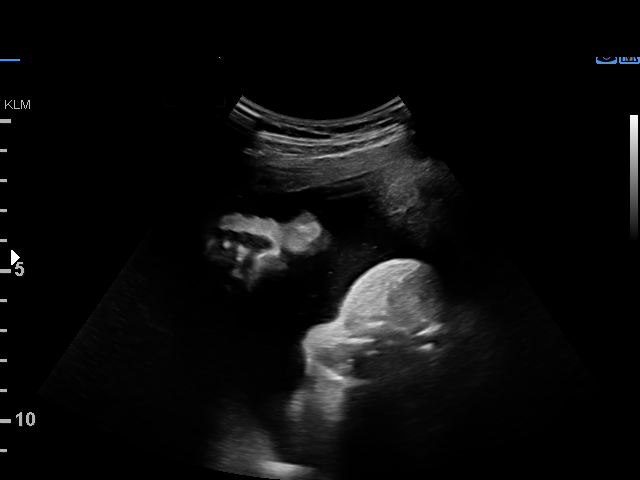
[im 35/38]
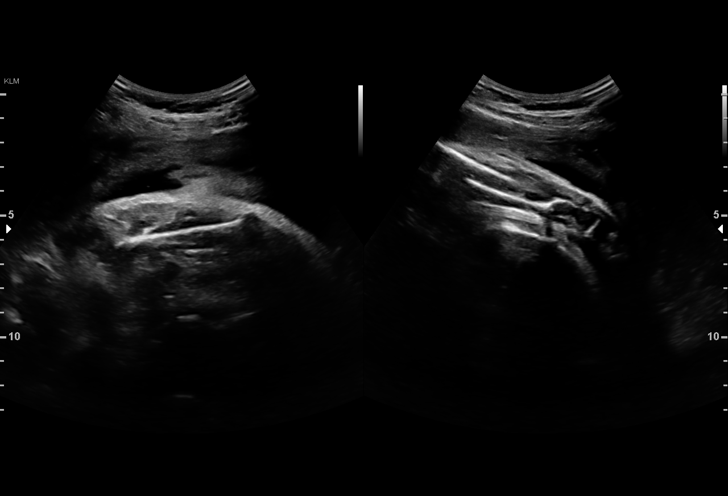
[im 38/38]
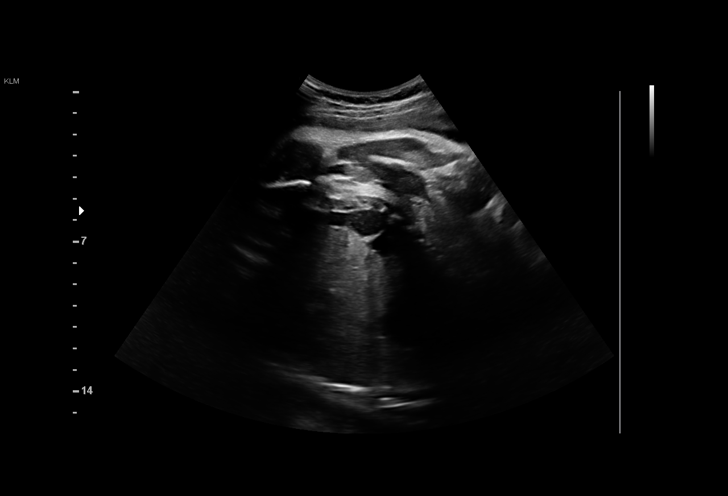

[14 of 28 positions shown; findings below may reference images not displayed]

Obstetrics &
Gynecology
[address]
[place]

1  MUSTAPHA M GIADOM            [id number]      [id number]     [id number]
2  MUSTAPHA M GIADOM            [id number]      [id number]     [id number]
Indications

36 weeks gestation of pregnancy
Small for gestational age fetus affecting
management of mother
OB History

Gravidity:    1         Term:   0        Prem:   0        SAB:   0
TOP:          0       Ectopic:  0        Living: 0
Fetal Evaluation

Num Of Fetuses:     1
Fetal Heart         145
Rate(bpm):
Cardiac Activity:   Observed
Presentation:       Cephalic
Placenta:           Posterior, above cervical os

Amniotic Fluid
AFI FV:      Subjectively within normal limits

AFI Sum(cm)     %Tile       Largest Pocket(cm)
19.57           75

RUQ(cm)       RLQ(cm)       LUQ(cm)        LLQ(cm)
3.6
Comment:    [DATE] BPP in 10 mins
Biophysical Evaluation

Amniotic F.V:   Within normal limits       F. Tone:        Observed
F. Movement:    Observed                   Score:          [DATE]
F. Breathing:   Observed
Biometry

BPD:      92.4  mm     G. Age:  37w 4d         81  %    CI:        79.04   %    70 - 86
FL/HC:      21.1   %    20.8 -
HC:      328.6  mm     G. Age:  37w 3d         35  %    HC/AC:      1.03        0.92 -
AC:      317.8  mm     G. Age:  35w 5d         31  %    FL/BPD:     75.1   %    71 - 87
FL:       69.4  mm     G. Age:  35w 4d         20  %    FL/AC:      21.8   %    20 - 24

Est. FW:    1340  gm      6 lb 4 oz     50  %
Gestational Age

LMP:           36w 6d        Date:  08/29/15                 EDD:   06/04/16
U/S Today:     36w 4d                                        EDD:   06/06/16
Best:          36w 6d     Det. By:  LMP  (08/29/15)          EDD:   06/04/16
Anatomy

Cranium:               Appears normal         Aortic Arch:            Not well visualized
Cavum:                 Not well visualized    Ductal Arch:            Not well visualized
Ventricles:            Appears normal         Diaphragm:              Appears normal
Choroid Plexus:        Appears normal         Stomach:                Appears normal, left
sided
Cerebellum:            Not well visualized    Abdomen:                Appears normal
Posterior Fossa:       Not well visualized    Abdominal Wall:         Not well visualized
Nuchal Fold:           Not applicable (>20    Cord Vessels:           Appears normal (3
wks GA)                                        vessel cord)
Face:                  Profile nl; orbits not Kidneys:                Appear normal
well visualized
Lips:                  Appears normal         Bladder:                Appears normal
Thoracic:              Appears normal         Spine:                  Not well visualized
Heart:                 Appears normal         Upper Extremities:      Visualized
(4CH, axis, and situs
RVOT:                  Not well visualized    Lower Extremities:      Visualized
LVOT:                  Not well visualized

Other:  Fetus appears to be a female. Technically difficult due to advanced
gestational age.
Cervix Uterus Adnexa

Cervix
Not visualized (advanced GA >71wks)
Impression

Singleton intrauterine pregnancy at 36+6 weeks
Review of the anatomy shows no sonographic markers for
aneuploidy or structural anomalies; however, this evaluation
should be considered suboptimal secondary to the late
gestational age
Amniotic fluid volume is normal with an AFI of 19.6cm
Estimated fetal weight is 2836g which is growth in the 50th
percentile. Abdominal circumfrence is in the 31st percentile
BPP [DATE]
Recommendations

There is no evidence of growth restriction. discussed the
findings with the patient and her partner. I would not
recommend elective delivery prior to 39 weeks, but I would
consider induction if the patient gets to her due date without
spontaneus labor.
She was scheduled for a formal MFM consult this afternoon,
but given the US findings we did not perform the consult and
simply discussed our findings with the patient and appended
our recommendations here

## 2018-10-31 ENCOUNTER — Other Ambulatory Visit (HOSPITAL_COMMUNITY): Payer: Self-pay | Admitting: Endocrinology

## 2018-10-31 DIAGNOSIS — E0590 Thyrotoxicosis, unspecified without thyrotoxic crisis or storm: Secondary | ICD-10-CM

## 2018-11-13 ENCOUNTER — Ambulatory Visit (HOSPITAL_COMMUNITY): Admission: RE | Admit: 2018-11-13 | Payer: BC Managed Care – PPO | Source: Ambulatory Visit

## 2020-09-08 ENCOUNTER — Other Ambulatory Visit: Payer: Self-pay | Admitting: Obstetrics and Gynecology

## 2020-10-22 ENCOUNTER — Encounter (HOSPITAL_BASED_OUTPATIENT_CLINIC_OR_DEPARTMENT_OTHER): Payer: Self-pay

## 2020-10-22 ENCOUNTER — Ambulatory Visit (HOSPITAL_BASED_OUTPATIENT_CLINIC_OR_DEPARTMENT_OTHER): Admit: 2020-10-22 | Payer: BC Managed Care – PPO | Admitting: Obstetrics and Gynecology

## 2020-10-22 SURGERY — SALPINGECTOMY, BILATERAL, LAPAROSCOPIC
Anesthesia: Choice | Laterality: Bilateral

## 2022-11-24 LAB — OB RESULTS CONSOLE RPR: RPR: NONREACTIVE

## 2022-11-24 LAB — HEPATITIS C ANTIBODY: HCV Ab: NEGATIVE

## 2022-11-24 LAB — OB RESULTS CONSOLE HEPATITIS B SURFACE ANTIGEN: Hepatitis B Surface Ag: NEGATIVE

## 2022-11-24 LAB — OB RESULTS CONSOLE RUBELLA ANTIBODY, IGM: Rubella: IMMUNE

## 2023-01-11 ENCOUNTER — Other Ambulatory Visit: Payer: Self-pay | Admitting: Obstetrics and Gynecology

## 2023-01-11 DIAGNOSIS — Z363 Encounter for antenatal screening for malformations: Secondary | ICD-10-CM

## 2023-01-11 DIAGNOSIS — O09522 Supervision of elderly multigravida, second trimester: Secondary | ICD-10-CM

## 2023-01-13 ENCOUNTER — Other Ambulatory Visit: Payer: Self-pay

## 2023-01-18 DIAGNOSIS — O09529 Supervision of elderly multigravida, unspecified trimester: Secondary | ICD-10-CM | POA: Insufficient documentation

## 2023-01-25 ENCOUNTER — Ambulatory Visit: Payer: BC Managed Care – PPO | Attending: Obstetrics and Gynecology

## 2023-01-25 ENCOUNTER — Encounter: Payer: Self-pay | Admitting: *Deleted

## 2023-01-25 ENCOUNTER — Other Ambulatory Visit: Payer: Self-pay | Admitting: *Deleted

## 2023-01-25 ENCOUNTER — Ambulatory Visit: Payer: BC Managed Care – PPO | Admitting: *Deleted

## 2023-01-25 VITALS — BP 91/45 | HR 83

## 2023-01-25 DIAGNOSIS — O09522 Supervision of elderly multigravida, second trimester: Secondary | ICD-10-CM | POA: Diagnosis not present

## 2023-01-25 DIAGNOSIS — O09529 Supervision of elderly multigravida, unspecified trimester: Secondary | ICD-10-CM | POA: Diagnosis present

## 2023-01-25 DIAGNOSIS — O34219 Maternal care for unspecified type scar from previous cesarean delivery: Secondary | ICD-10-CM

## 2023-01-25 DIAGNOSIS — Z363 Encounter for antenatal screening for malformations: Secondary | ICD-10-CM | POA: Diagnosis present

## 2023-01-25 DIAGNOSIS — Z3A19 19 weeks gestation of pregnancy: Secondary | ICD-10-CM

## 2023-03-15 LAB — OB RESULTS CONSOLE HIV ANTIBODY (ROUTINE TESTING): HIV: NONREACTIVE

## 2023-04-12 ENCOUNTER — Encounter: Payer: Self-pay | Admitting: *Deleted

## 2023-04-12 DIAGNOSIS — O99019 Anemia complicating pregnancy, unspecified trimester: Secondary | ICD-10-CM | POA: Insufficient documentation

## 2023-04-19 ENCOUNTER — Ambulatory Visit: Payer: BC Managed Care – PPO | Attending: Obstetrics and Gynecology

## 2023-04-19 DIAGNOSIS — O09513 Supervision of elderly primigravida, third trimester: Secondary | ICD-10-CM

## 2023-04-19 DIAGNOSIS — O09522 Supervision of elderly multigravida, second trimester: Secondary | ICD-10-CM | POA: Diagnosis present

## 2023-04-19 DIAGNOSIS — Z3A31 31 weeks gestation of pregnancy: Secondary | ICD-10-CM | POA: Diagnosis not present

## 2023-04-19 DIAGNOSIS — O34219 Maternal care for unspecified type scar from previous cesarean delivery: Secondary | ICD-10-CM

## 2023-04-20 ENCOUNTER — Other Ambulatory Visit: Payer: Self-pay | Admitting: *Deleted

## 2023-04-20 DIAGNOSIS — O34219 Maternal care for unspecified type scar from previous cesarean delivery: Secondary | ICD-10-CM

## 2023-04-20 DIAGNOSIS — O09523 Supervision of elderly multigravida, third trimester: Secondary | ICD-10-CM

## 2023-05-23 LAB — OB RESULTS CONSOLE GBS: GBS: NEGATIVE

## 2023-05-24 ENCOUNTER — Ambulatory Visit: Payer: BC Managed Care – PPO

## 2023-05-25 ENCOUNTER — Other Ambulatory Visit: Payer: Self-pay

## 2023-05-25 ENCOUNTER — Ambulatory Visit: Payer: BC Managed Care – PPO | Attending: Maternal & Fetal Medicine

## 2023-05-25 DIAGNOSIS — O09513 Supervision of elderly primigravida, third trimester: Secondary | ICD-10-CM

## 2023-05-25 DIAGNOSIS — O34219 Maternal care for unspecified type scar from previous cesarean delivery: Secondary | ICD-10-CM | POA: Diagnosis not present

## 2023-05-25 DIAGNOSIS — Z3A36 36 weeks gestation of pregnancy: Secondary | ICD-10-CM

## 2023-05-25 DIAGNOSIS — O09523 Supervision of elderly multigravida, third trimester: Secondary | ICD-10-CM | POA: Insufficient documentation

## 2023-06-08 ENCOUNTER — Inpatient Hospital Stay (HOSPITAL_COMMUNITY)
Admission: AD | Admit: 2023-06-08 | Discharge: 2023-06-11 | DRG: 787 | Disposition: A | Discharge status: Home / Self Care | Payer: BC Managed Care – PPO | Attending: Obstetrics and Gynecology | Admitting: Obstetrics and Gynecology

## 2023-06-08 ENCOUNTER — Inpatient Hospital Stay (HOSPITAL_COMMUNITY): Payer: BC Managed Care – PPO | Admitting: Anesthesiology

## 2023-06-08 ENCOUNTER — Encounter (HOSPITAL_COMMUNITY): Payer: Self-pay | Admitting: Obstetrics and Gynecology

## 2023-06-08 DIAGNOSIS — D62 Acute posthemorrhagic anemia: Secondary | ICD-10-CM | POA: Diagnosis not present

## 2023-06-08 DIAGNOSIS — O34211 Maternal care for low transverse scar from previous cesarean delivery: Principal | ICD-10-CM | POA: Diagnosis present

## 2023-06-08 DIAGNOSIS — A6000 Herpesviral infection of urogenital system, unspecified: Secondary | ICD-10-CM | POA: Diagnosis present

## 2023-06-08 DIAGNOSIS — O9081 Anemia of the puerperium: Secondary | ICD-10-CM | POA: Diagnosis not present

## 2023-06-08 DIAGNOSIS — O9832 Other infections with a predominantly sexual mode of transmission complicating childbirth: Secondary | ICD-10-CM | POA: Diagnosis present

## 2023-06-08 DIAGNOSIS — Z3A38 38 weeks gestation of pregnancy: Secondary | ICD-10-CM

## 2023-06-08 DIAGNOSIS — O479 False labor, unspecified: Principal | ICD-10-CM | POA: Diagnosis present

## 2023-06-08 DIAGNOSIS — O99344 Other mental disorders complicating childbirth: Secondary | ICD-10-CM | POA: Diagnosis present

## 2023-06-08 DIAGNOSIS — F32A Depression, unspecified: Secondary | ICD-10-CM | POA: Diagnosis present

## 2023-06-08 DIAGNOSIS — F419 Anxiety disorder, unspecified: Secondary | ICD-10-CM | POA: Diagnosis present

## 2023-06-08 DIAGNOSIS — O26893 Other specified pregnancy related conditions, third trimester: Secondary | ICD-10-CM | POA: Diagnosis present

## 2023-06-08 LAB — TYPE AND SCREEN
ABO/RH(D): A POS
Antibody Screen: NEGATIVE

## 2023-06-08 LAB — CBC
HCT: 31.7 % — ABNORMAL LOW (ref 36.0–46.0)
Hemoglobin: 10.6 g/dL — ABNORMAL LOW (ref 12.0–15.0)
MCH: 29.7 pg (ref 26.0–34.0)
MCHC: 33.4 g/dL (ref 30.0–36.0)
MCV: 88.8 fL (ref 80.0–100.0)
Platelets: 226 10*3/uL (ref 150–400)
RBC: 3.57 MIL/uL — ABNORMAL LOW (ref 3.87–5.11)
RDW: 14.5 % (ref 11.5–15.5)
WBC: 7.6 10*3/uL (ref 4.0–10.5)
nRBC: 0 % (ref 0.0–0.2)

## 2023-06-08 LAB — RUPTURE OF MEMBRANE (ROM)PLUS: Rom Plus: POSITIVE

## 2023-06-08 MED ORDER — ACETAMINOPHEN 325 MG PO TABS: 650.0000 mg | ORAL_TABLET | ORAL | Status: DC | PRN

## 2023-06-08 MED ORDER — PHENYLEPHRINE 80 MCG/ML (10ML) SYRINGE FOR IV PUSH (FOR BLOOD PRESSURE SUPPORT)
80.0000 ug | PREFILLED_SYRINGE | INTRAVENOUS | Status: DC | PRN
  Administered 2023-06-09: 80 ug via INTRAVENOUS

## 2023-06-08 MED ORDER — OXYTOCIN-SODIUM CHLORIDE 30-0.9 UT/500ML-% IV SOLN: 2.5000 [IU]/h | INTRAVENOUS | Status: DC

## 2023-06-08 MED ORDER — PHENYLEPHRINE 80 MCG/ML (10ML) SYRINGE FOR IV PUSH (FOR BLOOD PRESSURE SUPPORT)
80.0000 ug | PREFILLED_SYRINGE | INTRAVENOUS | Status: DC | PRN
  Filled 2023-06-08: qty 10

## 2023-06-08 MED ORDER — FENTANYL CITRATE (PF) 100 MCG/2ML IJ SOLN: 50.0000 ug | INTRAMUSCULAR | Status: DC | PRN

## 2023-06-08 MED ORDER — LACTATED RINGERS IV SOLN: 500.0000 mL | Freq: Once | INTRAVENOUS | Status: DC

## 2023-06-08 MED ORDER — ONDANSETRON HCL 4 MG/2ML IJ SOLN: 4.0000 mg | Freq: Four times a day (QID) | INTRAMUSCULAR | Status: DC | PRN

## 2023-06-08 MED ORDER — OXYCODONE-ACETAMINOPHEN 5-325 MG PO TABS: 1.0000 | ORAL_TABLET | ORAL | Status: DC | PRN

## 2023-06-08 MED ORDER — TERBUTALINE SULFATE 1 MG/ML IJ SOLN
0.2500 mg | Freq: Once | INTRAMUSCULAR | Status: AC
  Administered 2023-06-08: 0.25 mg via SUBCUTANEOUS

## 2023-06-08 MED ORDER — SOD CITRATE-CITRIC ACID 500-334 MG/5ML PO SOLN
30.0000 mL | ORAL | Status: DC | PRN
  Administered 2023-06-09: 30 mL via ORAL
  Filled 2023-06-08: qty 30

## 2023-06-08 MED ORDER — EPHEDRINE 5 MG/ML INJ
10.0000 mg | INTRAVENOUS | Status: DC | PRN
Start: 2023-06-08 — End: 2023-06-09

## 2023-06-08 MED ORDER — LIDOCAINE HCL (PF) 1 % IJ SOLN
30.0000 mL | INTRAMUSCULAR | Status: DC | PRN
Start: 2023-06-08 — End: 2023-06-09

## 2023-06-08 MED ORDER — LACTATED RINGERS IV SOLN: Freq: Once | INTRAVENOUS | Status: AC

## 2023-06-08 MED ORDER — LACTATED RINGERS IV SOLN
500.0000 mL | INTRAVENOUS | Status: DC | PRN
  Administered 2023-06-08 (×2): 1000 mL via INTRAVENOUS
  Administered 2023-06-09: 500 mL via INTRAVENOUS

## 2023-06-08 MED ORDER — FENTANYL-BUPIVACAINE-NACL 0.5-0.125-0.9 MG/250ML-% EP SOLN
12.0000 mL/h | EPIDURAL | Status: DC | PRN
  Administered 2023-06-08: 12 mL/h via EPIDURAL
  Filled 2023-06-08: qty 250

## 2023-06-08 MED ORDER — TERBUTALINE SULFATE 1 MG/ML IJ SOLN
INTRAMUSCULAR | Status: AC
  Administered 2023-06-09: 1 mg
  Filled 2023-06-08: qty 1

## 2023-06-08 MED ORDER — DIPHENHYDRAMINE HCL 50 MG/ML IJ SOLN: 12.5000 mg | INTRAMUSCULAR | Status: DC | PRN

## 2023-06-08 MED ORDER — OXYTOCIN BOLUS FROM INFUSION: 333.0000 mL | Freq: Once | INTRAVENOUS | Status: DC

## 2023-06-08 MED ORDER — LACTATED RINGERS IV SOLN: INTRAVENOUS | Status: DC

## 2023-06-08 MED ORDER — OXYCODONE-ACETAMINOPHEN 5-325 MG PO TABS: 2.0000 | ORAL_TABLET | ORAL | Status: DC | PRN

## 2023-06-08 NOTE — H&P (Signed)
Casey Campbell is a 37 y.o. female presenting for contractions and LOF .  Her contractions started last night increasing in intensity and frequency.  She thinks she started leaking at around 0600.  In MAu she had one late decel so the decision was made to keep her.  Her Amnisure was also noted to be positive. . OB History     Gravida  2   Para  1   Term  1   Preterm      AB      Living  1      SAB      IAB      Ectopic      Multiple  0   Live Births  1          Past Medical History:  Diagnosis Date   Delayed delivery after SROM (spontaneous rupture of membranes) 06/04/2016   HSV (herpes simplex virus) anogenital infection    Hx of varicella    SPROM (prolonged spontaneous rupture of membranes) 06/04/2016   Vaginal Pap smear, abnormal    Past Surgical History:  Procedure Laterality Date   CESAREAN SECTION N/A 06/04/2016   Procedure: CESAREAN SECTION;  Surgeon: Tilda Burrow, MD;  Location: [facility];  Service: Obstetrics;  Laterality: N/A;   COLPOSCOPY     Family History: family history is not on file. Social History:  reports that she has never smoked. She has never used smokeless tobacco. She reports that she does not drink alcohol and does not use drugs.     Maternal Diabetes: No Genetic Screening: Normal Maternal Ultrasounds/Referrals: Normal Fetal Ultrasounds or other Referrals:  None Maternal Substance Abuse:  No Significant Maternal Medications:  Meds include: Other:  Significant Maternal Lab Results:  Group B Strep negative Number of Prenatal Visits:greater than 3 verified prenatal visits Maternal Vaccinations:TDap Other Comments:  HSV.  SHE IS ON VALTREX VBAC she had two layer closure and consent signed.    Review of Systems  Physical Examination: General appearance - alert, well appearing, and in no distress Chest - clear to auscultation, no wheezes, rales or rhonchi, symmetric air entry Heart - normal rate and regular  rhythm Abdomen - soft, nontender, nondistended, no masses or organomegaly gravid Extremities - peripheral pulses normal, no pedal edema, no clubbing or cyanosis, Homan's sign negative bilaterally  History Dilation: Fingertip Effacement (%): 50 Station: -1 Exam by:: Boone Master, RN Blood pressure (!) 102/57, pulse 81, temperature 98.7 F (37.1 C), temperature source Axillary, resp. rate 16, height 5\' 3"  (1.6 m), weight 65.6 kg, last menstrual period 09/12/2022, SpO2 99%, unknown if currently breastfeeding. Exam Physical Exam  Prenatal labs: ABO, Rh: --/--/A POS (11/28 1419) Antibody: NEG (11/28 1419) Rubella:   RPR:    HBsAg:    HIV:    GBS: Negative/-- (11/12 0000)   Assessment/Plan: IUP AT 38 WEEKS SROM WITH CONTRACTIONS ADMIT TO L&D GBS NEG MAY START PITOCIN IF NO CHANGE FOR RIPENING PAIN MEDICATION AVAILABLE FOR REQUEST SROM LOOKS LIKE LIGHT MEC.  WILL MONITOR HSV.  ON VALTREX NO PRODROME OR LESIONS SEEN ON EXAM ANTICIPATE VBAC.  R&B REVIEWED   Doshia Dalia A Daelon Dunivan 06/08/2023, 8:10 PM

## 2023-06-08 NOTE — Progress Notes (Signed)
Pt still with contractions BP (!) 102/57   Pulse 84   Temp 98.1 F (36.7 C) (Oral)   Resp 17   Ht 5\' 3"  (1.6 m)   Wt 65.6 kg   LMP 09/12/2022   SpO2 100%   BMI 25.63 kg/m  FHTS 120 WITH late decels to 80s for 3 min Toco q 1-2 min Terb given IV bolus given Fetal status improved Will monitor Bulging bag present Will monitor closely

## 2023-06-08 NOTE — MAU Note (Signed)
Casey Campbell is a 37 y.o. at [redacted]w[redacted]d here in MAU reporting: had [name] all day yesterday. between 0700-0900, started feeling more like a real contraction. Now around every . ?water leaking, has had 2 change pants twice, clear fluid. No bleeding. Hasn't been feeling movement, just felt now.   Was closed on Tues Onset of complaint: this morning Pain score: 6 Vitals:   06/08/23 1223  BP: 109/64  Pulse: (!) 103  Resp: 18  Temp: 97.8 F (36.6 C)  SpO2: 100%     FHT:147 Lab orders placed from triage:  fern   Visitor policy explained

## 2023-06-09 ENCOUNTER — Encounter (HOSPITAL_COMMUNITY)
Admission: AD | Disposition: A | Discharge status: Home / Self Care | Payer: Self-pay | Source: Home / Self Care | Attending: Obstetrics and Gynecology

## 2023-06-09 ENCOUNTER — Other Ambulatory Visit: Payer: Self-pay

## 2023-06-09 ENCOUNTER — Encounter (HOSPITAL_COMMUNITY): Payer: Self-pay | Admitting: Obstetrics and Gynecology

## 2023-06-09 LAB — RPR: RPR Ser Ql: NONREACTIVE

## 2023-06-09 SURGERY — Surgical Case
Anesthesia: Epidural

## 2023-06-09 MED ORDER — DEXAMETHASONE SODIUM PHOSPHATE 10 MG/ML IJ SOLN
INTRAMUSCULAR | Status: AC
  Filled 2023-06-09: qty 1

## 2023-06-09 MED ORDER — MORPHINE SULFATE (PF) 0.5 MG/ML IJ SOLN
INTRAMUSCULAR | Status: AC
  Filled 2023-06-09: qty 10

## 2023-06-09 MED ORDER — COCONUT OIL OIL: 1.0000 | TOPICAL_OIL | Status: DC | PRN

## 2023-06-09 MED ORDER — PRENATAL MULTIVITAMIN CH
1.0000 | ORAL_TABLET | Freq: Every day | ORAL | Status: DC
  Administered 2023-06-09 – 2023-06-10 (×2): 1 via ORAL
  Filled 2023-06-09 (×2): qty 1

## 2023-06-09 MED ORDER — ACETAMINOPHEN 325 MG PO TABS
650.0000 mg | ORAL_TABLET | ORAL | Status: DC | PRN
  Administered 2023-06-10 – 2023-06-11 (×3): 650 mg via ORAL
  Filled 2023-06-09 (×3): qty 2

## 2023-06-09 MED ORDER — ACETAMINOPHEN 10 MG/ML IV SOLN
INTRAVENOUS | Status: DC | PRN
  Administered 2023-06-09: 1000 mg via INTRAVENOUS

## 2023-06-09 MED ORDER — MENTHOL 3 MG MT LOZG: 1.0000 | LOZENGE | OROMUCOSAL | Status: DC | PRN

## 2023-06-09 MED ORDER — ONDANSETRON HCL 4 MG/2ML IJ SOLN
INTRAMUSCULAR | Status: DC | PRN
  Administered 2023-06-09: 4 mg via INTRAVENOUS

## 2023-06-09 MED ORDER — KETOROLAC TROMETHAMINE 30 MG/ML IJ SOLN
30.0000 mg | Freq: Once | INTRAMUSCULAR | Status: AC | PRN
  Administered 2023-06-09: 30 mg via INTRAVENOUS

## 2023-06-09 MED ORDER — IBUPROFEN 600 MG PO TABS
600.0000 mg | ORAL_TABLET | Freq: Four times a day (QID) | ORAL | Status: DC | PRN
  Administered 2023-06-10 – 2023-06-11 (×5): 600 mg via ORAL
  Filled 2023-06-09 (×5): qty 1

## 2023-06-09 MED ORDER — OXYTOCIN-SODIUM CHLORIDE 30-0.9 UT/500ML-% IV SOLN
INTRAVENOUS | Status: DC | PRN
  Administered 2023-06-09: 300 mL via INTRAVENOUS

## 2023-06-09 MED ORDER — KETOROLAC TROMETHAMINE 30 MG/ML IJ SOLN
30.0000 mg | Freq: Four times a day (QID) | INTRAMUSCULAR | Status: AC | PRN
  Administered 2023-06-09: 30 mg via INTRAVENOUS
  Filled 2023-06-09: qty 1

## 2023-06-09 MED ORDER — LIDOCAINE HCL (PF) 1 % IJ SOLN
INTRAMUSCULAR | Status: DC | PRN
  Administered 2023-06-08 (×2): 4 mL via EPIDURAL

## 2023-06-09 MED ORDER — DIPHENHYDRAMINE HCL 50 MG/ML IJ SOLN: 12.5000 mg | INTRAMUSCULAR | Status: DC | PRN

## 2023-06-09 MED ORDER — MORPHINE SULFATE (PF) 0.5 MG/ML IJ SOLN
INTRAMUSCULAR | Status: DC | PRN
  Administered 2023-06-09: 3 mg via EPIDURAL

## 2023-06-09 MED ORDER — ZOLPIDEM TARTRATE 5 MG PO TABS: 5.0000 mg | ORAL_TABLET | Freq: Every evening | ORAL | Status: DC | PRN

## 2023-06-09 MED ORDER — OXYCODONE HCL 5 MG PO TABS
5.0000 mg | ORAL_TABLET | Freq: Four times a day (QID) | ORAL | Status: DC | PRN
  Filled 2023-06-09: qty 1

## 2023-06-09 MED ORDER — MEPERIDINE HCL 25 MG/ML IJ SOLN
6.2500 mg | INTRAMUSCULAR | Status: DC | PRN
Start: 2023-06-09 — End: 2023-06-09

## 2023-06-09 MED ORDER — STERILE WATER FOR IRRIGATION IR SOLN
Status: DC | PRN
  Administered 2023-06-09: 1000 mL

## 2023-06-09 MED ORDER — KETOROLAC TROMETHAMINE 30 MG/ML IJ SOLN
INTRAMUSCULAR | Status: AC
  Filled 2023-06-09: qty 1

## 2023-06-09 MED ORDER — TRANEXAMIC ACID-NACL 1000-0.7 MG/100ML-% IV SOLN
INTRAVENOUS | Status: AC
  Filled 2023-06-09: qty 100

## 2023-06-09 MED ORDER — GABAPENTIN 100 MG PO CAPS: 100.0000 mg | ORAL_CAPSULE | Freq: Two times a day (BID) | ORAL | Status: DC | PRN

## 2023-06-09 MED ORDER — SOD CITRATE-CITRIC ACID 500-334 MG/5ML PO SOLN: 30.0000 mL | ORAL | Status: DC

## 2023-06-09 MED ORDER — SIMETHICONE 80 MG PO CHEW
80.0000 mg | CHEWABLE_TABLET | Freq: Three times a day (TID) | ORAL | Status: DC
  Administered 2023-06-09 – 2023-06-11 (×3): 80 mg via ORAL
  Filled 2023-06-09 (×4): qty 1

## 2023-06-09 MED ORDER — NALOXONE HCL 0.4 MG/ML IJ SOLN: 0.4000 mg | INTRAMUSCULAR | Status: DC | PRN

## 2023-06-09 MED ORDER — LIDOCAINE-EPINEPHRINE (PF) 2 %-1:200000 IJ SOLN
INTRAMUSCULAR | Status: DC | PRN
  Administered 2023-06-09: 5 mL via EPIDURAL
  Administered 2023-06-09: 2 mL via EPIDURAL
  Administered 2023-06-09: 3 mL via EPIDURAL

## 2023-06-09 MED ORDER — ACETAMINOPHEN 500 MG PO TABS
1000.0000 mg | ORAL_TABLET | Freq: Four times a day (QID) | ORAL | Status: AC
Start: 2023-06-09 — End: 2023-06-10
  Administered 2023-06-09 – 2023-06-10 (×4): 1000 mg via ORAL
  Filled 2023-06-09 (×4): qty 2

## 2023-06-09 MED ORDER — ONDANSETRON HCL 4 MG/2ML IJ SOLN
INTRAMUSCULAR | Status: AC
  Filled 2023-06-09: qty 2

## 2023-06-09 MED ORDER — NALOXONE HCL 4 MG/10ML IJ SOLN: 1.0000 ug/kg/h | INTRAVENOUS | Status: DC | PRN

## 2023-06-09 MED ORDER — SODIUM CHLORIDE 0.9 % IR SOLN
Status: DC | PRN
  Administered 2023-06-09: 1

## 2023-06-09 MED ORDER — FENTANYL CITRATE (PF) 100 MCG/2ML IJ SOLN
INTRAMUSCULAR | Status: DC | PRN
  Administered 2023-06-09: 100 ug via INTRATHECAL

## 2023-06-09 MED ORDER — OXYCODONE HCL 5 MG PO TABS
5.0000 mg | ORAL_TABLET | ORAL | Status: DC | PRN
Start: 2023-06-09 — End: 2023-06-11
  Administered 2023-06-11: 5 mg via ORAL

## 2023-06-09 MED ORDER — SENNOSIDES-DOCUSATE SODIUM 8.6-50 MG PO TABS
2.0000 | ORAL_TABLET | Freq: Every day | ORAL | Status: DC
  Administered 2023-06-10 – 2023-06-11 (×2): 2 via ORAL
  Filled 2023-06-09 (×2): qty 2

## 2023-06-09 MED ORDER — TERBUTALINE SULFATE 1 MG/ML IJ SOLN
INTRAMUSCULAR | Status: AC
  Administered 2023-06-09: 1 mg
  Filled 2023-06-09: qty 1

## 2023-06-09 MED ORDER — DIBUCAINE (PERIANAL) 1 % EX OINT: 1.0000 | TOPICAL_OINTMENT | CUTANEOUS | Status: DC | PRN

## 2023-06-09 MED ORDER — KETOROLAC TROMETHAMINE 30 MG/ML IJ SOLN
30.0000 mg | Freq: Four times a day (QID) | INTRAMUSCULAR | Status: AC | PRN
  Filled 2023-06-09: qty 1

## 2023-06-09 MED ORDER — DIPHENHYDRAMINE HCL 25 MG PO CAPS: 25.0000 mg | ORAL_CAPSULE | Freq: Four times a day (QID) | ORAL | Status: DC | PRN

## 2023-06-09 MED ORDER — SCOPOLAMINE 1 MG/3DAYS TD PT72: 1.0000 | MEDICATED_PATCH | Freq: Once | TRANSDERMAL | Status: DC

## 2023-06-09 MED ORDER — OXYTOCIN-SODIUM CHLORIDE 30-0.9 UT/500ML-% IV SOLN
2.5000 [IU]/h | INTRAVENOUS | Status: AC
Start: 2023-06-09 — End: 2023-06-10
  Administered 2023-06-09: 2.5 [IU]/h via INTRAVENOUS
  Filled 2023-06-09: qty 500

## 2023-06-09 MED ORDER — VALACYCLOVIR HCL 500 MG PO TABS
500.0000 mg | ORAL_TABLET | Freq: Every day | ORAL | Status: DC
  Administered 2023-06-09 – 2023-06-11 (×3): 500 mg via ORAL
  Filled 2023-06-09 (×3): qty 1

## 2023-06-09 MED ORDER — OXYTOCIN-SODIUM CHLORIDE 30-0.9 UT/500ML-% IV SOLN
INTRAVENOUS | Status: AC
Start: 2023-06-09 — End: ?
  Filled 2023-06-09: qty 500

## 2023-06-09 MED ORDER — SODIUM CHLORIDE 0.9 % IV SOLN
500.0000 mg | INTRAVENOUS | Status: AC
  Administered 2023-06-09: 500 mg via INTRAVENOUS

## 2023-06-09 MED ORDER — SODIUM CHLORIDE 0.9 % IV SOLN: 12.5000 mg | INTRAVENOUS | Status: DC | PRN

## 2023-06-09 MED ORDER — CEFAZOLIN SODIUM-DEXTROSE 2-4 GM/100ML-% IV SOLN
2.0000 g | INTRAVENOUS | Status: AC
  Administered 2023-06-09: 2 g via INTRAVENOUS

## 2023-06-09 MED ORDER — CEFAZOLIN SODIUM-DEXTROSE 2-4 GM/100ML-% IV SOLN
INTRAVENOUS | Status: AC
  Filled 2023-06-09: qty 100

## 2023-06-09 MED ORDER — WITCH HAZEL-GLYCERIN EX PADS: 1.0000 | MEDICATED_PAD | CUTANEOUS | Status: DC | PRN

## 2023-06-09 MED ORDER — LIDOCAINE-EPINEPHRINE (PF) 2 %-1:200000 IJ SOLN
INTRAMUSCULAR | Status: AC
  Filled 2023-06-09: qty 20

## 2023-06-09 MED ORDER — TRANEXAMIC ACID-NACL 1000-0.7 MG/100ML-% IV SOLN
INTRAVENOUS | Status: DC | PRN
  Administered 2023-06-09: 1000 mg via INTRAVENOUS

## 2023-06-09 MED ORDER — FENTANYL CITRATE (PF) 100 MCG/2ML IJ SOLN
INTRAMUSCULAR | Status: AC
  Filled 2023-06-09: qty 2

## 2023-06-09 MED ORDER — DIPHENHYDRAMINE HCL 25 MG PO CAPS: 25.0000 mg | ORAL_CAPSULE | ORAL | Status: DC | PRN

## 2023-06-09 MED ORDER — ONDANSETRON HCL 4 MG/2ML IJ SOLN: 4.0000 mg | Freq: Three times a day (TID) | INTRAMUSCULAR | Status: DC | PRN

## 2023-06-09 MED ORDER — SIMETHICONE 80 MG PO CHEW: 80.0000 mg | CHEWABLE_TABLET | ORAL | Status: DC | PRN

## 2023-06-09 MED ORDER — SODIUM CHLORIDE 0.9% FLUSH: 3.0000 mL | INTRAVENOUS | Status: DC | PRN

## 2023-06-09 MED ORDER — HYDROMORPHONE HCL 1 MG/ML IJ SOLN
0.2500 mg | INTRAMUSCULAR | Status: DC | PRN
Start: 2023-06-09 — End: 2023-06-09

## 2023-06-09 MED ORDER — ACETAMINOPHEN 10 MG/ML IV SOLN
INTRAVENOUS | Status: AC
  Filled 2023-06-09: qty 100

## 2023-06-09 SURGICAL SUPPLY — 33 items
BENZOIN TINCTURE PRP APPL 2/3 (GAUZE/BANDAGES/DRESSINGS) ×1 IMPLANT
CHLORAPREP W/TINT 26 (MISCELLANEOUS) ×2 IMPLANT
CLAMP UMBILICAL CORD (MISCELLANEOUS) ×1 IMPLANT
CLOTH BEACON ORANGE TIMEOUT ST (SAFETY) ×1 IMPLANT
DRAIN JACKSON PRT FLT 10 (DRAIN) IMPLANT
DRSG OPSITE POSTOP 4X10 (GAUZE/BANDAGES/DRESSINGS) ×1 IMPLANT
ELECT REM PT RETURN 9FT ADLT (ELECTROSURGICAL) ×1
ELECTRODE REM PT RTRN 9FT ADLT (ELECTROSURGICAL) ×1 IMPLANT
EVACUATOR SILICONE 100CC (DRAIN) IMPLANT
EXTRACTOR VACUUM M CUP 4 TUBE (SUCTIONS) IMPLANT
GLOVE BIO SURGEON STRL SZ 6.5 (GLOVE) ×1 IMPLANT
GLOVE BIOGEL PI IND STRL 7.0 (GLOVE) ×2 IMPLANT
GOWN STRL REUS W/TWL LRG LVL3 (GOWN DISPOSABLE) ×2 IMPLANT
HEMOSTAT ARISTA ABSORB 3G PWDR (HEMOSTASIS) IMPLANT
KIT ABG SYR 3ML LUER SLIP (SYRINGE) IMPLANT
NDL HYPO 25X5/8 SAFETYGLIDE (NEEDLE) IMPLANT
NEEDLE HYPO 25X5/8 SAFETYGLIDE (NEEDLE) IMPLANT
NS IRRIG 1000ML POUR BTL (IV SOLUTION) ×1 IMPLANT
PACK C SECTION WH (CUSTOM PROCEDURE TRAY) ×1 IMPLANT
PAD OB MATERNITY 4.3X12.25 (PERSONAL CARE ITEMS) ×1 IMPLANT
RTRCTR C-SECT PINK 25CM LRG (MISCELLANEOUS) IMPLANT
STRIP CLOSURE SKIN 1/2X4 (GAUZE/BANDAGES/DRESSINGS) ×1 IMPLANT
SUT CHROMIC 0 CT 1 (SUTURE) ×1 IMPLANT
SUT MNCRL AB 3-0 PS2 27 (SUTURE) ×1 IMPLANT
SUT PLAIN 2 0 XLH (SUTURE) ×1 IMPLANT
SUT PLAIN ABS 2-0 CT1 27XMFL (SUTURE) ×2 IMPLANT
SUT SILK 2 0 SH (SUTURE) IMPLANT
SUT VIC AB 0 CTX36XBRD ANBCTRL (SUTURE) ×4 IMPLANT
SUT VIC AB 2-0 SH 27XBRD (SUTURE) IMPLANT
TOWEL OR 17X24 6PK STRL BLUE (TOWEL DISPOSABLE) ×1 IMPLANT
TRAY FOLEY W/BAG SLVR 14FR LF (SET/KITS/TRAYS/PACK) ×1 IMPLANT
VACUUM CUP M-STYLE MYSTIC II (SUCTIONS) IMPLANT
WATER STERILE IRR 1000ML POUR (IV SOLUTION) ×1 IMPLANT

## 2023-06-09 NOTE — Op Note (Signed)
Cesarean Section Procedure Note   Casey Campbell  06/09/2023  Indications: Failed VBAC and fetal intolerance to labor   Meconium  Failure to dilate.  Pre-operative Diagnosis: repeat cesarean for nonreassuring fetal heart rate and failure to progress.   Post-operative Diagnosis: Same   Surgeon: Surgeons and Role:    * Jaymes Graff, MD - Primary   Assistants: none   Anesthesia: epidural   Procedure Details:  The patient was seen in the Holding Room. The risks, benefits, complications, treatment options, and expected outcomes were discussed with the patient. The patient concurred with the proposed plan, giving informed consent. identified as Casey Campbell and the procedure verified as C-Section Delivery. A Time Out was held and the above information confirmed.  After induction of anesthesia, the patient was draped and prepped in the usual sterile manner. A transverse incision was made and carried down through the subcutaneous tissue to the fascia. Fascial incision was made in the midline and extended transversely. The fascia was separated from the underlying rectus muscle superiorly and inferiorly. The peritoneum was identified and entered. Peritoneal incision was extended longitudinally with good visualization of bowel and bladder. The utero-vesical peritoneal reflection was incised transversely and the bladder flap was bluntly freed from the lower uterine segment.  An alexsis retractor was placed in the abdomen.   A low transverse uterine incision was made. Delivered from cephalic presentation was a  infant, with Apgar scores of 1 at one minute and 8 at five minutes. Cord ph was sent the umbilical cord was clamped and cut cord blood was obtained for evaluation. The placenta was removed Intact and appeared normal. The uterine outline, tubes and ovaries appeared normal}. The uterine incision was closed with running locked sutures of 0Vicryl.   Hemostasis was observed. Lavage was carried out  until clear. The alexsis was removed.  The peritoneum was closed with 0 chromic.  The muscles were examined and any bleeders were made hemostatic using bovie cautery device.   The fascia was then reapproximated with running sutures of 0 vicryl.  The subcutaneous tissue was reapproximated  With interrupted stitches using 2-0 plain gut. The subcuticular closure was performed using 3-65monocryl     Instrument, sponge, and needle counts were correct prior the abdominal closure and were correct at the conclusion of the case.    Findings: infant was delivered from vtx presentation. The fluid was thick mecoinim stained.  The uterus tubes and ovaries appeared normal.  Inside the uterus was also meconium stained   Estimated Blood Loss: 132cc   Total IV Fluids:   Urine Output:  200CC OF pink colored urine  Specimens: placenta to path  Complications: no complications  Disposition: PACU - hemodynamically stable.   Maternal Condition: stable   Baby condition / location:  Couplet care / Skin to Skin  Attending Attestation: I performed the procedure.   Signed: Surgeon(s): Jaymes Graff, MD

## 2023-06-09 NOTE — Anesthesia Procedure Notes (Signed)
Epidural Patient location during procedure: OB Start time: 06/08/2023 11:20 PM End time: 06/08/2023 11:25 PM  Staffing Anesthesiologist: Linton Rump, MD Performed: anesthesiologist   Preanesthetic Checklist Completed: patient identified, IV checked, site marked, risks and benefits discussed, surgical consent, monitors and equipment checked, pre-op evaluation and timeout performed  Epidural Patient position: sitting Prep: DuraPrep and site prepped and draped Patient monitoring: continuous pulse ox and blood pressure Approach: midline Location: L3-L4 Injection technique: LOR saline  Needle:  Needle type: Tuohy  Needle gauge: 17 G Needle length: 9 cm and 9 Needle insertion depth: 3.5 cm Catheter type: closed end flexible Catheter size: 19 Gauge Catheter at skin depth: 8 cm Test dose: negative  Assessment Events: blood not aspirated, no cerebrospinal fluid, injection not painful, no injection resistance, no paresthesia and negative IV test  Additional Notes The patient has requested an epidural for labor pain management. Risks and benefits including, but not limited to, infection, bleeding, local anesthetic toxicity, headache, hypotension, back pain, block failure, etc. were discussed with the patient. The patient expressed understanding and consented to the procedure. I confirmed that the patient has no bleeding disorders and is not taking blood thinners. I confirmed the patient's last platelet count with the nurse. A time-out was performed immediately prior to the procedure. Please see nursing documentation for vital signs. Sterile technique was used throughout the whole procedure. Once LOR achieved, the epidural catheter threaded easily without resistance. Aspiration of the catheter was negative for blood and CSF. The epidural was dosed slowly and an infusion was started.  1 attempt(s)Reason for block:procedure for pain

## 2023-06-09 NOTE — Transfer of Care (Signed)
Immediate Anesthesia Transfer of Care Note  Patient: Casey Campbell  Procedure(s) Performed: CESAREAN SECTION  Patient Location: PACU  Anesthesia Type:Epidural  Level of Consciousness: awake  Airway & Oxygen Therapy: Patient Spontanous Breathing  Post-op Assessment: Report given to RN and Post -op Vital signs reviewed and stable  Post vital signs: Reviewed and stable  Last Vitals:  Vitals Value Taken Time  BP 102/57 06/09/23 0745  Temp 36.5 C 06/09/23 0745  Pulse 99 06/09/23 0747  Resp 18 06/09/23 0747  SpO2 99 % 06/09/23 0747  Vitals shown include unfiled device data.  Last Pain:  Vitals:   06/09/23 0601  TempSrc:   PainSc: 0-No pain         Complications: No notable events documented.

## 2023-06-09 NOTE — Lactation Note (Signed)
This note was copied from a baby's chart. Lactation Consultation Note  Patient Name: Casey Campbell [id number]'[id number] Date: 06/09/2023 Age:37 hours Reason for consult: Initial assessment;Early term 37-38.6wks  P2- MOB reports that infant has nursed well multiple times since birth. MOB denies experiencing any pain or pinching while infant nursed. MOB states that it has been 7 years since she last hand expressed, so she would like for [facility] to demonstrate how to do it. MOB had visitors at the time and was trying to rest, so LC encouraged MOB to call out when she was ready for [facility] to teach her.  LC reviewed feeding infant on cue 8-12x in 24 hrs, not allowing infant to go over 3 hrs without a feeding, CDC milk storage guidelines, LC services handout and the expected first 24 hr birthday nap. LC encouraged MOB to call for further assistance as needed.  Maternal Data Has patient been taught Hand Expression?: No Does the patient have breastfeeding experience prior to this delivery?: Yes How long did the patient breastfeed?: 18 months  Feeding Mother's Current Feeding Choice: Breast Milk  Lactation Tools Discussed/Used Pump Education: Milk Storage  Interventions Interventions: Breast feeding basics reviewed;Education;LC Services brochure  Discharge Discharge Education: Warning signs for feeding baby Pump: Hands Free;Personal  Consult Status Consult Status: Follow-up Date: 06/10/23 Follow-up type: In-patient    Dema Severin BS, IBCLC 06/09/2023, 12:21 PM

## 2023-06-09 NOTE — Anesthesia Postprocedure Evaluation (Signed)
Anesthesia Post Note  Patient: Magali M Shober  Procedure(s) Performed: CESAREAN SECTION     Patient location during evaluation: PACU Anesthesia Type: Epidural Level of consciousness: awake Pain management: pain level controlled Vital Signs Assessment: post-procedure vital signs reviewed and stable Respiratory status: spontaneous breathing Cardiovascular status: stable Postop Assessment: no headache, no backache, epidural receding, patient able to bend at knees and no apparent nausea or vomiting Anesthetic complications: no  No notable events documented.  Last Vitals:  Vitals:   06/09/23 0815 06/09/23 0830  BP: 96/62 100/66  Pulse: 82 96  Resp: (!) 25 (!) 28  Temp:    SpO2: 99% 92%    Last Pain:  Vitals:   06/09/23 0830  TempSrc:   PainSc: 0-No pain            L Sensory Level: L5-Outer lower leg, top of foot, great toe (06/09/23 0830) R Sensory Level: L5-Outer lower leg, top of foot, great toe (06/09/23 0830)  Caren Macadam

## 2023-06-09 NOTE — Progress Notes (Signed)
Pt comfortable with epidural BP (!) 103/54   Pulse 80   Temp 97.9 F (36.6 C) (Axillary)   Resp 16   Ht 5\' 3"  (1.6 m)   Wt 65.6 kg   LMP 09/12/2022   SpO2 99%   BMI 25.63 kg/m   Cat 2  Toco q2-4 min Persistent cat 2 Cx unchanged Failed VBAc Intolerance to Labor Recommend CS R&B reviewed Will give zithromax and ancef and TXA

## 2023-06-09 NOTE — Anesthesia Preprocedure Evaluation (Signed)
Anesthesia Evaluation  Patient identified by MRN, date of birth, ID band Patient awake    Reviewed: Allergy & Precautions, NPO status , Patient's Chart, lab work & pertinent test results  History of Anesthesia Complications Negative for: history of anesthetic complications  Airway Mallampati: II  TM Distance: >3 FB Neck ROM: Full    Dental   Pulmonary neg pulmonary ROS   Pulmonary exam normal breath sounds clear to auscultation       Cardiovascular negative cardio ROS  Rhythm:Regular Rate:Normal     Neuro/Psych negative neurological ROS     GI/Hepatic negative GI ROS, Neg liver ROS,,,  Endo/Other  negative endocrine ROS    Renal/GU negative Renal ROS     Musculoskeletal   Abdominal   Peds  Hematology  (+) Blood dyscrasia, anemia Lab Results      Component                Value               Date                      WBC                      7.6                 06/08/2023                HGB                      10.6 (L)            06/08/2023                HCT                      31.7 (L)            06/08/2023                MCV                      88.8                06/08/2023                PLT                      226                 06/08/2023              Anesthesia Other Findings H/o c-section x1 in 2017 for fetal intolerance  Reproductive/Obstetrics (+) Pregnancy                             Anesthesia Physical Anesthesia Plan  ASA: 2  Anesthesia Plan: Epidural   Post-op Pain Management:    Induction:   PONV Risk Score and Plan:   Airway Management Planned: Natural Airway  Additional Equipment:   Intra-op Plan:   Post-operative Plan:   Informed Consent: I have reviewed the patients History and Physical, chart, labs and discussed the procedure including the risks, benefits and alternatives for the proposed anesthesia with the patient or authorized representative  who has indicated his/her understanding and acceptance.       Plan Discussed  with: Anesthesiologist  Anesthesia Plan Comments: (I have discussed risks of neuraxial anesthesia including but not limited to infection, bleeding, nerve injury, back pain, headache, seizures, and failure of block. Patient denies bleeding disorders and is not currently anticoagulated. Labs have been reviewed. Risks and benefits discussed. All patient's questions answered.  )       Anesthesia Quick Evaluation

## 2023-06-10 LAB — CBC
HCT: 24.8 % — ABNORMAL LOW (ref 36.0–46.0)
Hemoglobin: 8.1 g/dL — ABNORMAL LOW (ref 12.0–15.0)
MCH: 29.7 pg (ref 26.0–34.0)
MCHC: 32.7 g/dL (ref 30.0–36.0)
MCV: 90.8 fL (ref 80.0–100.0)
Platelets: 163 10*3/uL (ref 150–400)
RBC: 2.73 MIL/uL — ABNORMAL LOW (ref 3.87–5.11)
RDW: 14.9 % (ref 11.5–15.5)
WBC: 11.9 10*3/uL — ABNORMAL HIGH (ref 4.0–10.5)
nRBC: 0 % (ref 0.0–0.2)

## 2023-06-10 MED ORDER — FERROUS SULFATE 325 (65 FE) MG PO TABS
325.0000 mg | ORAL_TABLET | Freq: Every day | ORAL | Status: DC
  Administered 2023-06-10 – 2023-06-11 (×2): 325 mg via ORAL
  Filled 2023-06-10 (×2): qty 1

## 2023-06-10 NOTE — Progress Notes (Signed)
POSTPARTUM PROGRESS NOTE  POD #1  Subjective:  Casey Campbell is a 37 y.o. [id number] s/p rLTCS at [redacted]w[redacted]d.  She reports she doing well. No acute events overnight. She reports she is doing well. She denies any problems with ambulating, voiding or po intake. Denies nausea or vomiting. She has passed flatus. Pain is well controlled.  Lochia is appropriate.  Objective: Blood pressure 95/69, pulse 88, temperature 98.8 F (37.1 C), temperature source Oral, resp. rate 18, height 5\' 3"  (1.6 m), weight 65.6 kg, last menstrual period 09/12/2022, SpO2 100%, unknown if currently breastfeeding.  Physical Exam:  General: alert, cooperative and no distress Chest: no respiratory distress Heart:regular rate, distal pulses intact Abdomen: soft, nontender,  Uterine Fundus: firm, appropriately tender DVT Evaluation: No calf swelling or tenderness Extremities: No LE edema Skin: warm, dry; incision clean/dry/intact w/honeycomb dressing in place  Recent Labs    06/08/23 1421 06/10/23 0441  HGB 10.6* 8.1*  HCT 31.7* 24.8*    Assessment/Plan: Casey Campbell is a 37 y.o. [id number] s/p rLTCs at [redacted]w[redacted]d for failed VBAC due to FTP, NRFHT.  POD#1 - Doing well; pain well controlled. H/H appropriate  Routine postpartum care  OOB, ambulated  Lovenox for VTE prophylaxis Anemia: states she has notice her HR increasing when up and moving and possible palpitation.  -Start PO iron now but consider IV iron if symptoms continue -Repeat CBC in the morning.   Dispo: Plan for discharge 12/1-12/2.   LOS: 2 days   Lavonda Jumbo, DO OB Fellow, Faculty [facility] 06/10/2023, 10:47 AM

## 2023-06-11 LAB — CBC
HCT: 26.5 % — ABNORMAL LOW (ref 36.0–46.0)
Hemoglobin: 8.8 g/dL — ABNORMAL LOW (ref 12.0–15.0)
MCH: 30.9 pg (ref 26.0–34.0)
MCHC: 33.2 g/dL (ref 30.0–36.0)
MCV: 93 fL (ref 80.0–100.0)
Platelets: 188 10*3/uL (ref 150–400)
RBC: 2.85 MIL/uL — ABNORMAL LOW (ref 3.87–5.11)
RDW: 15.1 % (ref 11.5–15.5)
WBC: 8.5 10*3/uL (ref 4.0–10.5)
nRBC: 0 % (ref 0.0–0.2)

## 2023-06-11 MED ORDER — OXYCODONE HCL 5 MG PO TABS: 5.0000 mg | ORAL_TABLET | Freq: Four times a day (QID) | ORAL | 0 refills | Status: AC | PRN

## 2023-06-11 MED ORDER — SERTRALINE HCL 20 MG/ML PO CONC: 25.0000 mg | Freq: Every day | ORAL | Status: DC

## 2023-06-11 MED ORDER — SERTRALINE HCL 25 MG PO TABS: 25.0000 mg | ORAL_TABLET | Freq: Every day | ORAL | 0 refills | Status: AC

## 2023-06-11 MED ORDER — SERTRALINE HCL 25 MG PO TABS
25.0000 mg | ORAL_TABLET | Freq: Every day | ORAL | Status: DC
  Administered 2023-06-11: 25 mg via ORAL
  Filled 2023-06-11: qty 1

## 2023-06-11 MED ORDER — IBUPROFEN 600 MG PO TABS: 600.0000 mg | ORAL_TABLET | Freq: Four times a day (QID) | ORAL | 0 refills | Status: AC | PRN

## 2023-06-11 MED ORDER — ACETAMINOPHEN 325 MG PO TABS: 650.0000 mg | ORAL_TABLET | ORAL | 0 refills | Status: AC | PRN

## 2023-06-11 MED ORDER — FERROUS SULFATE 325 (65 FE) MG PO TABS: 325.0000 mg | ORAL_TABLET | Freq: Every day | ORAL | 0 refills | Status: AC

## 2023-06-11 NOTE — Lactation Note (Signed)
This note was copied from a baby's chart. Lactation Consultation Note  Patient Name: Boy Arlow Gerleman WUJWJ'X Date: 06/11/2023 Age:37 hours Reason for consult: Follow-up assessment;Early term 19-38.6wks  P2, Mother is experienced with breastfeeding and denies questions or concerns. Reviewed engorgement care and monitoring voids/stools. Suggest calling for help as needed.  Maternal Data Does the patient have breastfeeding experience prior to this delivery?: Yes  Feeding Mother's Current Feeding Choice: Breast Milk  Interventions Interventions: Education  Discharge Discharge Education: Engorgement and breast care;Warning signs for feeding baby  Consult Status Consult Status: Complete Date: 06/11/23    Dahlia Byes [facility] 06/11/2023, 12:00 PM

## 2023-06-11 NOTE — Discharge Summary (Signed)
Postpartum Discharge Summary     Patient Name: Casey Campbell DOB: Nov 22, 1985 MRN: [id number]  Date of admission: 06/08/2023 Delivery date:06/09/2023 Delivering provider: Jaymes Graff Date of discharge: 06/11/2023  Admitting diagnosis: Irregular contractions [O47.9] Intrauterine pregnancy: [redacted]w[redacted]d     Secondary diagnosis:  Principal Problem:   Irregular contractions  Additional problems: Anemia 2/2 acute blood loss; expected    Discharge diagnosis: Term Pregnancy Delivered, Anemia, and Anxiety depression                                               Post partum procedures: n/a Augmentation: N/A Complications: None  Hospital course: Onset of Labor With Unplanned C/S   37 y.o. yo [id number] at [redacted]w[redacted]d was admitted in Latent Labor on 06/08/2023 for VBAC. Patient had a labor course significant for arrest of dilation and inability to augment due to persistent Cat II strip. The patient went for cesarean section due to Arrest of Dilation and Non-Reassuring FHR. Delivery details as follows: Membrane Rupture Time/Date: 6:30 AM,06/08/2023  Delivery Method:C-Section, Low Transverse Operative Delivery:N/A Details of operation can be found in separate operative note. Patient had a postpartum course complicated by anemia due to acute blood loss that was expected. She was started on oral iron. Hemoglobin remained stable. On POD#2 she was started on zoloft per patient request due depression and anxiety.  She is ambulating,tolerating a regular diet, passing flatus, and urinating well.  Patient is discharged home in stable condition 06/11/23.  Newborn Data: Birth date:06/09/2023 Birth time:6:58 AM Gender:Female Living status:Living Apgars:1 ,8  Weight:3170 g  Magnesium Sulfate received: No BMZ received: No Rhophylac:No MMR:No T-DaP:Given prenatally Flu: No RSV Vaccine received: unknown Transfusion:No Immunizations administered: There is no immunization history for the selected administration  types on file for this patient.  Physical exam  Vitals:   06/10/23 0551 06/10/23 1326 06/10/23 2028 06/11/23 0515  BP: 95/69 94/61 99/64  101/65  Pulse: 88 89 91 87  Resp: 18 18 17 16   Temp: 98.8 F (37.1 C) 98.4 F (36.9 C) 97.8 F (36.6 C) 98 F (36.7 C)  TempSrc: Oral Oral Oral Oral  SpO2: 100%  100% 100%  Weight:      Height:       General: alert, cooperative, and no distress Lochia: appropriate Uterine Fundus: firm Incision: Healing well with no significant drainage, No significant erythema, Dressing is clean, dry, and intact DVT Evaluation: No evidence of DVT seen on physical exam. Negative Homan's sign. No cords or calf tenderness. No significant calf/ankle edema. Labs: Lab Results  Component Value Date   WBC 8.5 06/11/2023   HGB 8.8 (L) 06/11/2023   HCT 26.5 (L) 06/11/2023   MCV 93.0 06/11/2023   PLT 188 06/11/2023       No data to display         Edinburgh Score:    06/10/2023   10:15 AM  Edinburgh Postnatal Depression Scale Screening Tool  I have been able to laugh and see the funny side of things. 0  I have looked forward with enjoyment to things. 0  I have blamed myself unnecessarily when things went wrong. 1  I have been anxious or worried for no good reason. 2  I have felt scared or panicky for no good reason. 1  Things have been getting on top of me. 0  I have been so  unhappy that I have had difficulty sleeping. 0  I have felt sad or miserable. 0  I have been so unhappy that I have been crying. 0  The thought of harming myself has occurred to me. 0  Edinburgh Postnatal Depression Scale Total 4      After visit meds:  Allergies as of 06/11/2023   No Known Allergies      Medication List     TAKE these medications    acetaminophen 325 MG tablet Commonly known as: TYLENOL Take 2 tablets (650 mg total) by mouth every 4 (four) hours as needed for mild pain (pain score 1-3) (temperature > 101.5.).   ferrous sulfate 325 (65 FE) MG  tablet Take 1 tablet (325 mg total) by mouth daily with breakfast.   ibuprofen 600 MG tablet Commonly known as: ADVIL Take 1 tablet (600 mg total) by mouth every 6 (six) hours as needed for cramping.   oxyCODONE 5 MG immediate release tablet Commonly known as: Oxy IR/ROXICODONE Take 1 tablet (5 mg total) by mouth every 6 (six) hours as needed for severe pain (pain score 7-10) or breakthrough pain.   prenatal multivitamin Tabs tablet Take 1 tablet by mouth daily at 12 noon.   sertraline 25 MG tablet Commonly known as: ZOLOFT Take 1 tablet (25 mg total) by mouth daily. Start taking on: June 12, 2023   valACYclovir 500 MG tablet Commonly known as: VALTREX Take 500 mg by mouth daily.         Discharge home in stable condition Infant Feeding: Breast Infant Disposition:home with mother Discharge instruction: per After Visit Summary and Postpartum booklet. Activity: Advance as tolerated. Pelvic rest for 6 weeks.  Diet: routine diet Anticipated Birth Control: Unsure Postpartum Appointment:6 weeks Additional Postpartum F/U: Postpartum Depression checkup and Incision check 2 weeks; consider the need to increase zoloft in 2-4 weeks Future Appointments:No future appointments. Follow up Visit:  Follow-up Information     Osborn Coho, MD. Schedule an appointment as soon as possible for a visit in 2 week(s).   Specialty: Obstetrics and Gynecology Why: Incision check and depression check up Contact information: [address] [place] [address] [phone]                      06/11/2023 Randa Evens Autry-Lott, DO

## 2023-06-11 NOTE — Lactation Note (Signed)
This note was copied from a baby's chart. Lactation Consultation Note  Patient Name: Casey Campbell [id number] Date: 06/11/2023 Age:37 hours  Attempted to see mom but she was sleeping.   Maternal Data    Feeding    LATCH Score                    Lactation Tools Discussed/Used    Interventions    Discharge    Consult Status      Charyl Dancer 06/11/2023, 4:01 AM

## 2023-06-19 ENCOUNTER — Telehealth (HOSPITAL_COMMUNITY): Payer: Self-pay | Admitting: *Deleted

## 2023-06-19 NOTE — Telephone Encounter (Signed)
06/19/2023  Name: Casey Campbell MRN: [id number] DOB: 05-17-1986  Reason for Call:  Transition of Care Hospital Discharge Call  Contact Status: Patient Contact Status: Complete  Language assistant needed:          Follow-Up Questions: Do You Have Any Concerns About Your Health As You Heal From Delivery?: Yes What Concerns Do You Have About Your Health?: Patient asked, "Being that I had a c-section, how soon can I baby wear?" RN reviewed lifting precautions with patient. Discussed concern if wrap rubs on incision. Patient verbalized understanding. No other questions or concerns voiced at this time. Do You Have Any Concerns About Your Infants Health?: No  Edinburgh Postnatal Depression Scale:  In the Past 7 Days:  EPDS not completed at this time. Patient stated, "I completed that at [facility], when they started me on Zoloft." Patient being followed by her provider. No further evaluation needed at this time.   PHQ2-9 Depression Scale:     Discharge Follow-up: Edinburgh score requires follow up?: N/A Patient was advised of the following resources:: Breastfeeding Support Group, Support Group  Post-discharge interventions: Reviewed Newborn Safe Sleep Practices  Signature Deforest Hoyles, RN, 06/19/23, [phone]

## 2023-10-02 ENCOUNTER — Ambulatory Visit: Admit: 2023-10-02 | Payer: Self-pay | Admitting: Obstetrics and Gynecology

## 2023-10-02 SURGERY — SALPINGECTOMY, BILATERAL, LAPAROSCOPIC
Anesthesia: General | Laterality: Bilateral

## 2023-12-25 ENCOUNTER — Other Ambulatory Visit: Payer: Self-pay | Admitting: Obstetrics and Gynecology

## 2024-01-22 ENCOUNTER — Ambulatory Visit (HOSPITAL_COMMUNITY): Admit: 2024-01-22 | Admitting: Obstetrics and Gynecology

## 2024-01-22 SURGERY — SALPINGECTOMY, BILATERAL, LAPAROSCOPIC
Anesthesia: General | Laterality: Bilateral
